# Patient Record
Sex: Female | Born: 1945
Health system: Southern US, Community
[De-identification: ages and names within clinical notes are randomized; demographics above are authoritative.]

## PROBLEM LIST (undated history)

## (undated) DIAGNOSIS — I499 Cardiac arrhythmia, unspecified: Secondary | ICD-10-CM

## (undated) DIAGNOSIS — M810 Age-related osteoporosis without current pathological fracture: Secondary | ICD-10-CM

## (undated) DIAGNOSIS — H269 Unspecified cataract: Secondary | ICD-10-CM

## (undated) DIAGNOSIS — J302 Other seasonal allergic rhinitis: Secondary | ICD-10-CM

## (undated) DIAGNOSIS — D126 Benign neoplasm of colon, unspecified: Secondary | ICD-10-CM

## (undated) HISTORY — DX: Age-related osteoporosis without current pathological fracture: M81.0

## (undated) HISTORY — PX: OTHER SURGICAL HISTORY: SHX169

## (undated) HISTORY — PX: ABDOMINAL HYSTERECTOMY: SHX81

## (undated) HISTORY — DX: Unspecified cataract: H26.9

## (undated) HISTORY — PX: BREAST EXCISIONAL BIOPSY: SUR124

## (undated) HISTORY — PX: BREAST SURGERY: SHX581

---

## 2006-02-24 ENCOUNTER — Ambulatory Visit (HOSPITAL_COMMUNITY): Admission: RE | Admit: 2006-02-24 | Discharge: 2006-02-24 | Payer: Self-pay | Admitting: Pulmonary Disease

## 2010-06-08 DIAGNOSIS — D126 Benign neoplasm of colon, unspecified: Secondary | ICD-10-CM

## 2010-06-08 HISTORY — DX: Benign neoplasm of colon, unspecified: D12.6

## 2011-08-31 ENCOUNTER — Other Ambulatory Visit: Payer: Self-pay

## 2011-08-31 ENCOUNTER — Telehealth: Payer: Self-pay

## 2011-08-31 DIAGNOSIS — Z139 Encounter for screening, unspecified: Secondary | ICD-10-CM

## 2011-08-31 NOTE — Telephone Encounter (Signed)
MOVI PREP SPLIT DOSING, REGULAR BREAKFAST. CLEAR LIQUIDS AFTER 9 AM.  

## 2011-08-31 NOTE — Telephone Encounter (Signed)
Pt said her last colonoscopy was approximately in 2000 by Dr. Cleotis Nipper at [facility].   Gastroenterology Pre-Procedure Form   Request Date: 08/31/2011     Requesting Physician: Dr. Juanetta Gosling     PATIENT INFORMATION:  Karen Nunez is a 66 y.o., female (DOB=08-13-1945).  PROCEDURE: Procedure(s) requested: colonoscopy Procedure Reason: screening for colon cancer  PATIENT REVIEW QUESTIONS: The patient reports the following:   1. Diabetes Melitis: no 2. Joint replacements in the past 12 months: no 3. Major health problems in the past 3 months: no 4. Has an artificial valve or MVP:no 5. Has been advised in past to take antibiotics in advance of a procedure like teeth cleaning: no}    MEDICATIONS & ALLERGIES:    Patient reports the following regarding taking any blood thinners:   Plavix? no Aspirin?no Coumadin?  no  Patient confirms/reports the following medications: only multi vit and calcium plus D ( did not show up) No current outpatient prescriptions on file.    Patient confirms/reports the following allergies:  Allergies not on file  NKDA  Patient is appropriate to schedule for requested procedure(s): yes  AUTHORIZATION INFORMATION Primary Insurance:  ID #:   Group #:  Pre-Cert / Auth required: Pre-Cert / Auth #:   Secondary Insurance:   ID #:   Group #:  Pre-Cert / Auth required: Pre-Cert / Auth #:   No orders of the defined types were placed in this encounter.    SCHEDULE INFORMATION: Procedure has been scheduled as follows:  Date: 09/25/2011     Time: 10:30 AM  Location: [facility] Stay  This Gastroenterology Pre-Precedure Form is being routed to the following provider(s) for review: Jonette Eva, MD

## 2011-09-01 NOTE — Telephone Encounter (Signed)
Rx and instructions mailed.  

## 2011-09-25 ENCOUNTER — Encounter (HOSPITAL_COMMUNITY): Payer: Self-pay | Admitting: *Deleted

## 2011-09-25 ENCOUNTER — Encounter (HOSPITAL_COMMUNITY)
Admission: RE | Disposition: A | Discharge status: Home / Self Care | Payer: Self-pay | Source: Ambulatory Visit | Attending: Gastroenterology

## 2011-09-25 ENCOUNTER — Ambulatory Visit (HOSPITAL_COMMUNITY)
Admission: RE | Admit: 2011-09-25 | Discharge: 2011-09-25 | Disposition: A | Discharge status: Home / Self Care | Payer: BC Managed Care – PPO | Source: Ambulatory Visit | Attending: Gastroenterology | Admitting: Gastroenterology

## 2011-09-25 DIAGNOSIS — Z1211 Encounter for screening for malignant neoplasm of colon: Secondary | ICD-10-CM

## 2011-09-25 DIAGNOSIS — K621 Rectal polyp: Secondary | ICD-10-CM

## 2011-09-25 DIAGNOSIS — D126 Benign neoplasm of colon, unspecified: Secondary | ICD-10-CM

## 2011-09-25 DIAGNOSIS — K648 Other hemorrhoids: Secondary | ICD-10-CM | POA: Insufficient documentation

## 2011-09-25 DIAGNOSIS — K5730 Diverticulosis of large intestine without perforation or abscess without bleeding: Secondary | ICD-10-CM

## 2011-09-25 DIAGNOSIS — K620 Anal polyp: Secondary | ICD-10-CM

## 2011-09-25 DIAGNOSIS — Z139 Encounter for screening, unspecified: Secondary | ICD-10-CM

## 2011-09-25 DIAGNOSIS — D128 Benign neoplasm of rectum: Secondary | ICD-10-CM | POA: Insufficient documentation

## 2011-09-25 HISTORY — PX: COLONOSCOPY: SHX5424

## 2011-09-25 SURGERY — COLONOSCOPY
Anesthesia: Moderate Sedation

## 2011-09-25 MED ORDER — MIDAZOLAM HCL 5 MG/5ML IJ SOLN
INTRAMUSCULAR | Status: AC
  Filled 2011-09-25: qty 10

## 2011-09-25 MED ORDER — STERILE WATER FOR IRRIGATION IR SOLN
Status: DC | PRN
  Administered 2011-09-25: 10:00:00

## 2011-09-25 MED ORDER — SODIUM CHLORIDE 0.45 % IV SOLN
Freq: Once | INTRAVENOUS | Status: AC
  Administered 2011-09-25: 10:00:00 via INTRAVENOUS

## 2011-09-25 MED ORDER — MEPERIDINE HCL 100 MG/ML IJ SOLN
INTRAMUSCULAR | Status: AC
  Filled 2011-09-25: qty 2

## 2011-09-25 MED ORDER — MEPERIDINE HCL 100 MG/ML IJ SOLN
INTRAMUSCULAR | Status: DC | PRN
  Administered 2011-09-25 (×2): 25 mg via INTRAVENOUS

## 2011-09-25 MED ORDER — MIDAZOLAM HCL 5 MG/5ML IJ SOLN
INTRAMUSCULAR | Status: DC | PRN
  Administered 2011-09-25: 2 mg via INTRAVENOUS
  Administered 2011-09-25 (×2): 1 mg via INTRAVENOUS
  Administered 2011-09-25: 2 mg via INTRAVENOUS

## 2011-09-25 NOTE — Op Note (Signed)
[facility] [address] [place], [place]  [address]  COLONOSCOPY PROCEDURE REPORT  PATIENT:  Karen, Nunez  MR#:  [id number] BIRTHDATE:  [birth year], 65 yrs. old  GENDER:  female  ENDOSCOPIST:  Jonette Eva, MD REF. BY:  Kari Baars, M.D. ASSISTANT:  PROCEDURE DATE:  09/25/2011 PROCEDURE:  Colonoscopy with biopsy  INDICATIONS:  SCREENING  MEDICATIONS:   Demerol 50 mg IV, Versed 6 mg IV  DESCRIPTION OF PROCEDURE:    Physical exam was performed. Informed consent was obtained from the patient after explaining the benefits, risks, and alternatives to procedure.  The patient was connected to monitor and placed in left lateral position. Continuous oxygen was provided by nasal cannula and IV medicine administered through an indwelling cannula.  After administration of sedation and rectal exam, the patient's rectum was intubated and the EC-[id number] ([id number]) colonoscope was advanced under direct visualization to the cecum.  The scope was removed slowly by carefully examining the color, texture, anatomy, and integrity mucosa on the way out.  The patient was recovered in endoscopy and discharged home in satisfactory condition. <<PROCEDUREIMAGES>>  FINDINGS:  There wAS A 3 MM SESSILE polyp identified and removed. in the descending colon.  There were TWO 2 MMpolyps identified and removed. in the rectum. POLYPS REMOVED VIA COLD FORCEPS.  FREQUENT Diverticula were found throughout the colon. SMALL Internal Hemorrhoids were found.  PREP QUALITY: GOOD CECAL W/D TIME:      COMPLICATIONS:    None  ENDOSCOPIC IMPRESSION: 1) Polyp in the descending colon 2) Polyps, multiple in the rectum 3) DiverticulOSIS throughout the colon 4) Internal hemorrhoids  RECOMMENDATIONS: AWAIT BIOPSIES HIGH FIBER DIET TCS IN 10 YEARS  REPEAT EXAM:  No  ______________________________ Jonette Eva, MD  CC:  Kari Baars, M.D.  n. eSIGNED:   Notnamed Croucher at 09/25/2011 02:49  PM  Milbert Coulter, [phone]

## 2011-09-25 NOTE — H&P (Addendum)
  Primary Care Physician:  Fredirick Maudlin, MD, MD Primary Gastroenterologist:  Dr. Darrick Penna  Pre-Procedure History & Physical: HPI:  Karen Nunez is a 66 y.o. female here for COLON CANCER SCREENING.   Past Medical History  Diagnosis Date  . No pertinent past medical history     Past Surgical History  Procedure Date  . Abdominal hysterectomy   . Bilateral breast lumpectomies     all benign    Prior to Admission medications   Medication Sig Start Date End Date Taking? Authorizing Provider  Multiple Vitamin (MULTIVITAMIN) tablet Take 1 tablet by mouth daily.   Yes Historical Provider, MD  NON FORMULARY Calcium 600 mg plus Vit D       One tablet bid   Yes Historical Provider, MD    Allergies as of 08/31/2011  . (No Known Allergies)    Family History  Problem Relation Age of Onset  . Colon cancer Neg Hx     History   Social History  . Marital Status: Married    Spouse Name: N/A    Number of Children: N/A  . Years of Education: N/A   Occupational History  . Not on file.   Social History Main Topics  . Smoking status: Never Smoker   . Smokeless tobacco: Not on file  . Alcohol Use: Yes     occasional  . Drug Use: No  . Sexually Active:    Other Topics Concern  . Not on file   Social History Narrative  . No narrative on file    Review of Systems: See HPI, otherwise negative ROS  Physical Exam: BP 152/91  Pulse 97  Temp(Src) 97.3 F (36.3 C) (Oral)  Resp 16  Ht 5' 6.5" (1.689 m)  Wt 170 lb (77.111 kg)  BMI 27.03 kg/m2  SpO2 98% General:   Alert,  pleasant and cooperative in NAD Head:  Normocephalic and atraumatic. Neck:  Supple;  Lungs:  Clear throughout to auscultation.    Heart:  Regular rate and rhythm. Abdomen:  Soft, nontender and nondistended. Normal bowel sounds, without guarding, and without rebound.   Neurologic:  Alert and  oriented x4;  grossly normal neurologically.   Impression/Plan:    AVERAGE RISK  PLAN: TCS TODAY

## 2011-09-25 NOTE — Discharge Instructions (Signed)
You had 3 small polyps removed FROM YOUR COLON AND RECTUM. You have internal hemorrhoids and diverticulosis in your entire colon.   FOLLOW A HIGH FIBER DIET. AVOID ITEMS THAT CAUSE BLOATING. SEE INFO BELOW. YOUR BIOPSY RESULTS SHOULD BE BACK IN 7 DAYS. Next colonoscopy in 10 years.  Colonoscopy Care After Read the instructions outlined below and refer to this sheet in the next week. These discharge instructions provide you with general information on caring for yourself after you leave the hospital. While your treatment has been planned according to the most current medical practices available, unavoidable complications occasionally occur. If you have any problems or questions after discharge, call DR. Princella Jaskiewicz, [phone].  ACTIVITY  You may resume your regular activity, but move at a slower pace for the next 24 hours.   Take frequent rest periods for the next 24 hours.   Walking will help get rid of the air and reduce the bloated feeling in your belly (abdomen).   No driving for 24 hours (because of the medicine (anesthesia) used during the test).   You may shower.   Do not sign any important legal documents or operate any machinery for 24 hours (because of the anesthesia used during the test).    NUTRITION  Drink plenty of fluids.   You may resume your normal diet as instructed by your doctor.   Begin with a light meal and progress to your normal diet. Heavy or fried foods are harder to digest and may make you feel sick to your stomach (nauseated).   Avoid alcoholic beverages for 24 hours or as instructed.    MEDICATIONS  You may resume your normal medications.   WHAT YOU CAN EXPECT TODAY  Some feelings of bloating in the abdomen.   Passage of more gas than usual.   Spotting of blood in your stool or on the toilet paper  .  IF YOU HAD POLYPS REMOVED DURING THE COLONOSCOPY:  Eat a soft diet IF YOU HAVE NAUSEA, BLOATING, ABDOMINAL PAIN, OR VOMITING.    FINDING  OUT THE RESULTS OF YOUR TEST Not all test results are available during your visit. DR. Darrick Penna WILL CALL YOU WITHIN 7 DAYS OF YOUR PROCEDUE WITH YOUR RESULTS. Do not assume everything is normal if you have not heard from DR. Jule Schlabach IN ONE WEEK, CALL HER OFFICE AT [phone].  SEEK IMMEDIATE MEDICAL ATTENTION AND CALL THE OFFICE: [phone] IF:  You have more than a spotting of blood in your stool.   Your belly is swollen (abdominal distention).   You are nauseated or vomiting.   You have a temperature over 101F.   You have abdominal pain or discomfort that is severe or gets worse throughout the day.  Polyps, Colon  A polyp is extra tissue that grows inside your body. Colon polyps grow in the large intestine. The large intestine, also called the colon, is part of your digestive system. It is a long, hollow tube at the end of your digestive tract where your body makes and stores stool. Most polyps are not dangerous. They are benign. This means they are not cancerous. But over time, some types of polyps can turn into cancer. Polyps that are smaller than a pea are usually not harmful. But larger polyps could someday become or may already be cancerous. To be safe, doctors remove all polyps and test them.   WHO GETS POLYPS? Anyone can get polyps, but certain people are more likely than others. You may have a greater chance  of getting polyps if:  You are over 50.   You have had polyps before.   Someone in your family has had polyps.   Someone in your family has had cancer of the large intestine.   Find out if someone in your family has had polyps. You may also be more likely to get polyps if you:   Eat a lot of fatty foods   Smoke   Drink alcohol   Do not exercise  Eat too much   TREATMENT  The caregiver will remove the polyp during sigmoidoscopy or colonoscopy.  PREVENTION There is not one sure way to prevent polyps. You might be able to lower your risk of getting them if  you:  Eat more fruits and vegetables and less fatty food.   Do not smoke.   Avoid alcohol.   Exercise every day.   Lose weight if you are overweight.   Eating more calcium and folate can also lower your risk of getting polyps. Some foods that are rich in calcium are milk, cheese, and broccoli. Some foods that are rich in folate are chickpeas, kidney beans, and spinach.   High-Fiber Diet A high-fiber diet changes your normal diet to include more whole grains, legumes, fruits, and vegetables. Changes in the diet involve replacing refined carbohydrates with unrefined foods. The calorie level of the diet is essentially unchanged. The Dietary Reference Intake (recommended amount) for adult males is 38 grams per day. For adult females, it is 25 grams per day. Pregnant and lactating women should consume 28 grams of fiber per day. Fiber is the intact part of a plant that is not broken down during digestion. Functional fiber is fiber that has been isolated from the plant to provide a beneficial effect in the body. PURPOSE  Increase stool bulk.   Ease and regulate bowel movements.   Lower cholesterol.  INDICATIONS THAT YOU NEED MORE FIBER  Constipation and hemorrhoids.   Uncomplicated diverticulosis (intestine condition) and irritable bowel syndrome.   Weight management.   As a protective measure against hardening of the arteries (atherosclerosis), diabetes, and cancer.   GUIDELINES FOR INCREASING FIBER IN THE DIET  Start adding fiber to the diet slowly. A gradual increase of about 5 more grams (2 slices of whole-wheat bread, 2 servings of most fruits or vegetables, or 1 bowl of high-fiber cereal) per day is best. Too rapid an increase in fiber may result in constipation, flatulence, and bloating.   Drink enough water and fluids to keep your urine clear or pale yellow. Water, juice, or caffeine-free drinks are recommended. Not drinking enough fluid may cause constipation.   Eat a  variety of high-fiber foods rather than one type of fiber.   Try to increase your intake of fiber through using high-fiber foods rather than fiber pills or supplements that contain small amounts of fiber.   The goal is to change the types of food eaten. Do not supplement your present diet with high-fiber foods, but replace foods in your present diet.  INCLUDE A VARIETY OF FIBER SOURCES  Replace refined and processed grains with whole grains, canned fruits with fresh fruits, and incorporate other fiber sources. White rice, white breads, and most bakery goods contain little or no fiber.   Brown whole-grain rice, buckwheat oats, and many fruits and vegetables are all good sources of fiber. These include: broccoli, Brussels sprouts, cabbage, cauliflower, beets, sweet potatoes, white potatoes (skin on), carrots, tomatoes, eggplant, squash, berries, fresh fruits, and dried fruits.  Cereals appear to be the richest source of fiber. Cereal fiber is found in whole grains and bran. Bran is the fiber-rich outer coat of cereal grain, which is largely removed in refining. In whole-grain cereals, the bran remains. In breakfast cereals, the largest amount of fiber is found in those with "bran" in their names. The fiber content is sometimes indicated on the label.   You may need to include additional fruits and vegetables each day.   In baking, for 1 cup white flour, you may use the following substitutions:   1 cup whole-wheat flour minus 2 tablespoons.   1/2 cup white flour plus 1/2 cup whole-wheat flour.   Diverticulosis Diverticulosis is a common condition that develops when small pouches (diverticula) form in the wall of the colon. The risk of diverticulosis increases with age. It happens more often in people who eat a low-fiber diet. Most individuals with diverticulosis have no symptoms. Those individuals with symptoms usually experience belly (abdominal) pain, constipation, or loose stools  (diarrhea).  HOME CARE INSTRUCTIONS  Increase the amount of fiber in your diet as directed by your caregiver or dietician. This may reduce symptoms of diverticulosis.   Drink at least 6 to 8 glasses of water each day to prevent constipation.   Try not to strain when you have a bowel movement.   Avoiding nuts and seeds to prevent complications is still an uncertain benefit.       FOODS HAVING HIGH FIBER CONTENT INCLUDE:  Fruits. Apple, peach, pear, tangerine, raisins, prunes.   Vegetables. Brussels sprouts, asparagus, broccoli, cabbage, carrot, cauliflower, romaine lettuce, spinach, summer squash, tomato, winter squash, zucchini.   Starchy Vegetables. Baked beans, kidney beans, lima beans, split peas, lentils, potatoes (with skin).   Grains. Whole wheat bread, brown rice, bran flake cereal, plain oatmeal, white rice, shredded wheat, bran muffins.    SEEK IMMEDIATE MEDICAL CARE IF:  You develop increasing pain or severe bloating.   You have an oral temperature above 101F.   You develop vomiting or bowel movements that are bloody or black.   Hemorrhoids Hemorrhoids are dilated (enlarged) veins around the rectum. Sometimes clots will form in the veins. This makes them swollen and painful. These are called thrombosed hemorrhoids. Causes of hemorrhoids include:  Constipation.   Straining to have a bowel movement.   HEAVY LIFTING HOME CARE INSTRUCTIONS  Eat a well balanced diet and drink 6 to 8 glasses of water every day to avoid constipation. You may also use a bulk laxative.   Avoid straining to have bowel movements.   Keep anal area dry and clean.   Do not use a donut shaped pillow or sit on the toilet for long periods. This increases blood pooling and pain.   Move your bowels when your body has the urge; this will require less straining and will decrease pain and pressure.

## 2011-09-29 ENCOUNTER — Encounter (HOSPITAL_COMMUNITY): Payer: Self-pay | Admitting: Gastroenterology

## 2011-09-30 ENCOUNTER — Telehealth: Payer: Self-pay | Admitting: Gastroenterology

## 2011-09-30 NOTE — Telephone Encounter (Signed)
Pt returned call and was informed.  

## 2011-09-30 NOTE — Telephone Encounter (Signed)
Results Cc to PCP  

## 2011-09-30 NOTE — Telephone Encounter (Signed)
Please call pt. She had HYPERPLASTIC POLYPS removed from her colon & RECTUM. TCS in 10 years. High fiber diet.

## 2011-09-30 NOTE — Telephone Encounter (Signed)
LM for pt to call

## 2013-11-13 ENCOUNTER — Other Ambulatory Visit (HOSPITAL_COMMUNITY): Payer: Self-pay | Admitting: Pulmonary Disease

## 2013-11-13 ENCOUNTER — Ambulatory Visit (HOSPITAL_COMMUNITY)
Admission: RE | Admit: 2013-11-13 | Discharge: 2013-11-13 | Disposition: A | Discharge status: Home / Self Care | Payer: Medicare HMO | Source: Ambulatory Visit | Attending: Pulmonary Disease | Admitting: Pulmonary Disease

## 2013-11-13 DIAGNOSIS — R52 Pain, unspecified: Secondary | ICD-10-CM

## 2013-11-13 DIAGNOSIS — M898X9 Other specified disorders of bone, unspecified site: Secondary | ICD-10-CM | POA: Insufficient documentation

## 2013-11-13 DIAGNOSIS — M79609 Pain in unspecified limb: Secondary | ICD-10-CM | POA: Insufficient documentation

## 2014-11-27 ENCOUNTER — Other Ambulatory Visit (HOSPITAL_COMMUNITY): Payer: Self-pay | Admitting: Pulmonary Disease

## 2014-11-27 DIAGNOSIS — Z0000 Encounter for general adult medical examination without abnormal findings: Secondary | ICD-10-CM | POA: Diagnosis not present

## 2014-11-27 DIAGNOSIS — Z780 Asymptomatic menopausal state: Secondary | ICD-10-CM

## 2014-11-28 DIAGNOSIS — F329 Major depressive disorder, single episode, unspecified: Secondary | ICD-10-CM | POA: Diagnosis not present

## 2014-11-28 DIAGNOSIS — E785 Hyperlipidemia, unspecified: Secondary | ICD-10-CM | POA: Diagnosis not present

## 2014-11-28 DIAGNOSIS — Z0000 Encounter for general adult medical examination without abnormal findings: Secondary | ICD-10-CM | POA: Diagnosis not present

## 2014-12-04 ENCOUNTER — Ambulatory Visit (HOSPITAL_COMMUNITY)
Admission: RE | Admit: 2014-12-04 | Discharge: 2014-12-04 | Disposition: A | Discharge status: Home / Self Care | Payer: Medicare PPO | Source: Ambulatory Visit | Attending: Pulmonary Disease | Admitting: Pulmonary Disease

## 2014-12-04 DIAGNOSIS — Z780 Asymptomatic menopausal state: Secondary | ICD-10-CM | POA: Insufficient documentation

## 2014-12-04 DIAGNOSIS — M898X8 Other specified disorders of bone, other site: Secondary | ICD-10-CM | POA: Diagnosis not present

## 2015-06-28 IMAGING — CR DG OS CALCIS 2+V*R*
2 series · 2 of 2 positions shown · non-contrast
Comparison: None.

CLINICAL DATA: Heel pain for 2 months.

EXAM:
RIGHT OS CALCIS - 2+ VIEW

[view not recorded (1 of 2)]
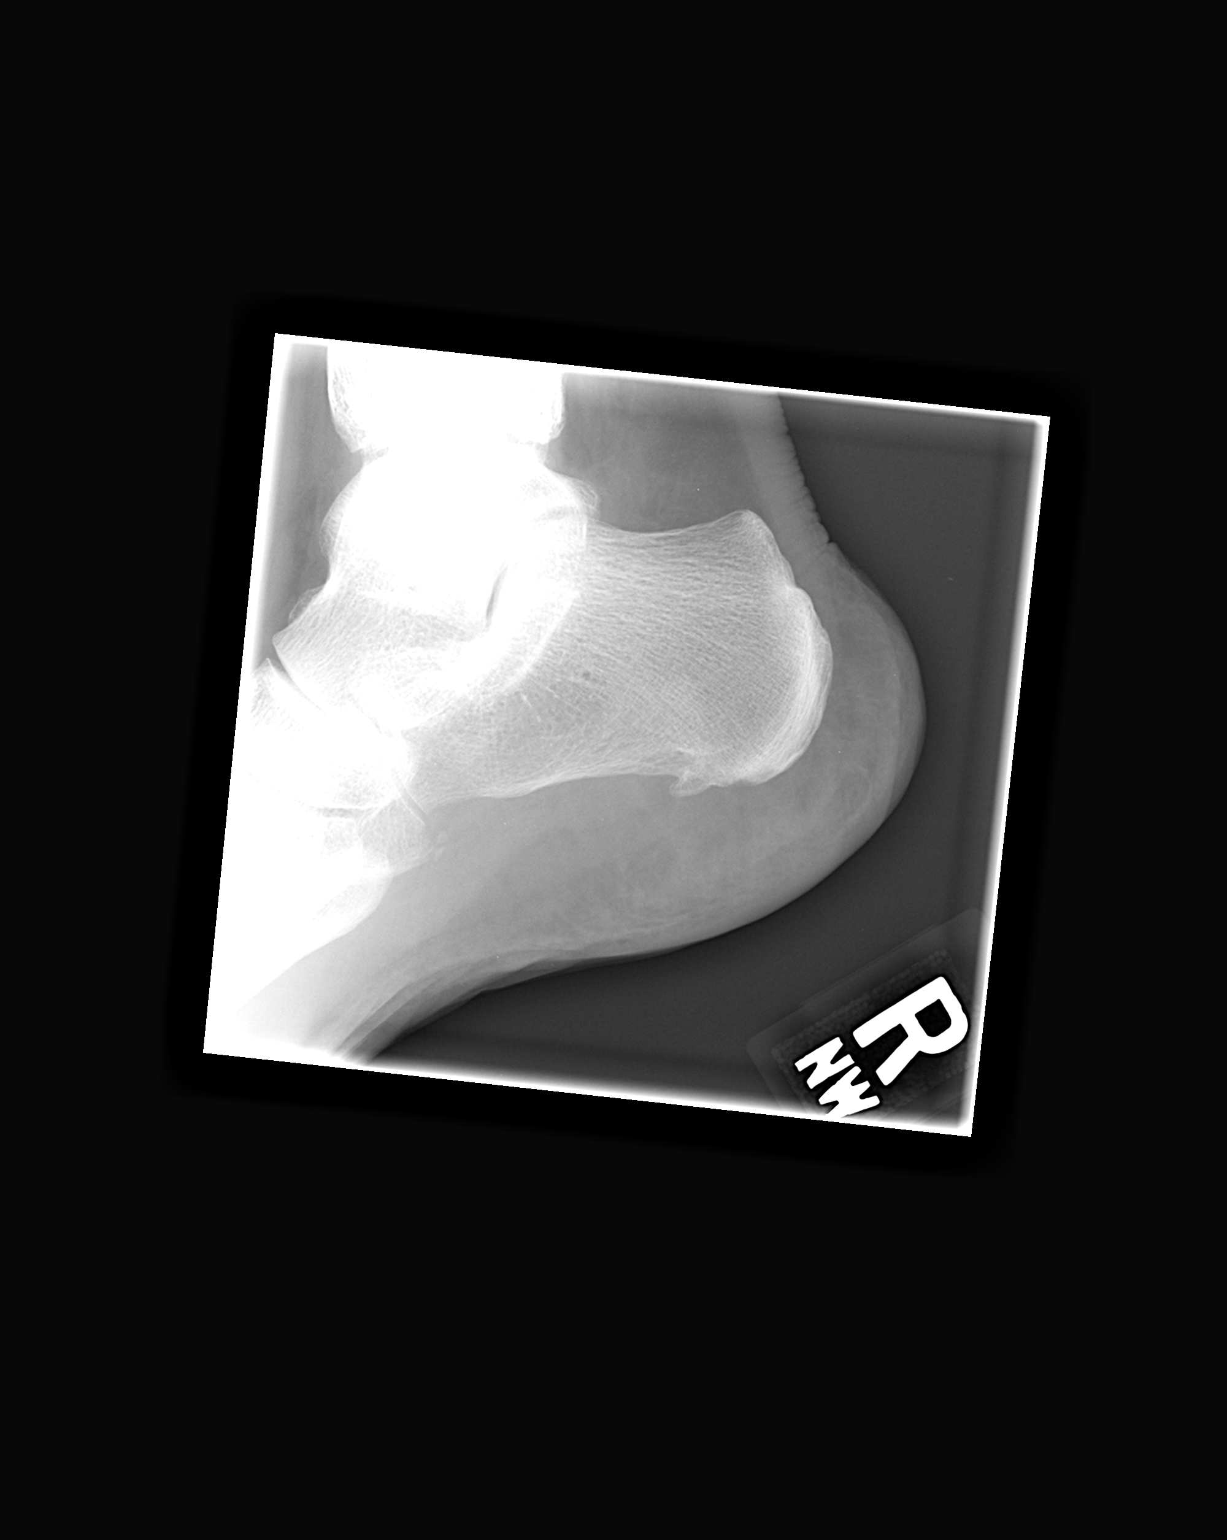

[view not recorded (2 of 2)]
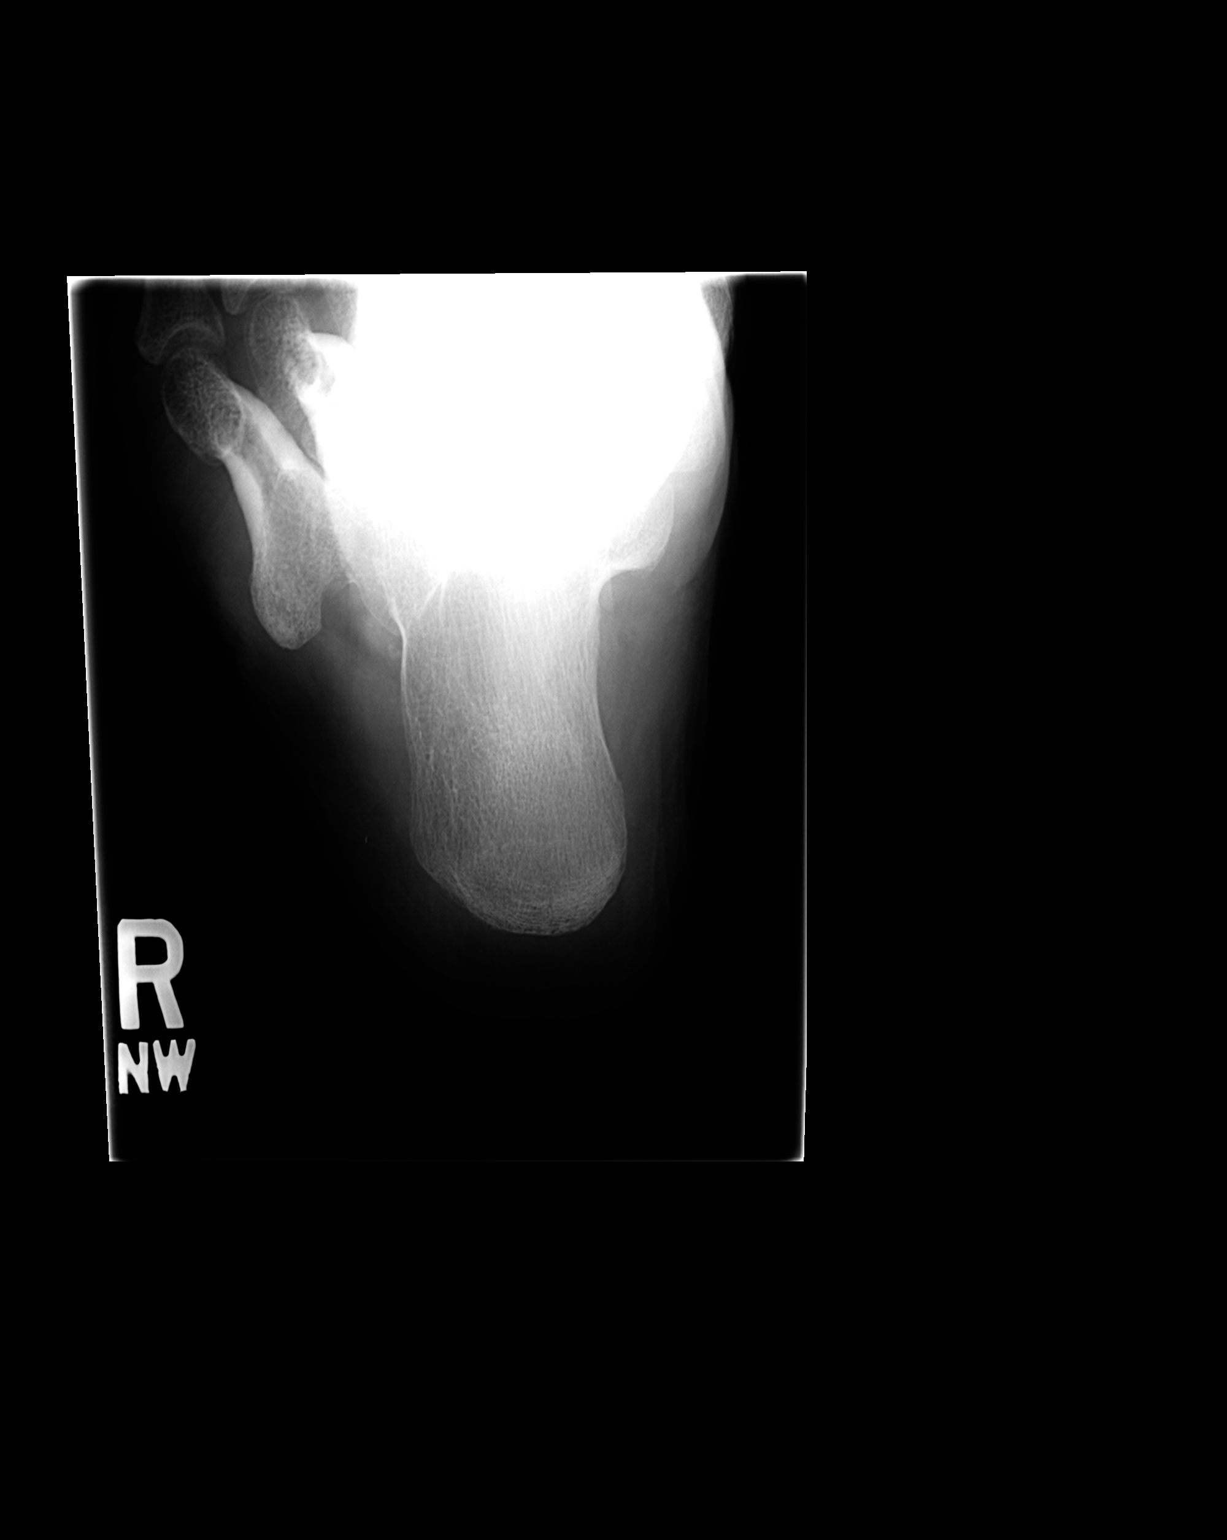

[2 of 2 positions shown; findings below may reference images not displayed]

FINDINGS: Small to moderate-size plantar spur. Limited evaluation of the
plantar fascia by plain film examination.

No fracture or dislocation.
IMPRESSION: Plantar spur.

## 2016-02-19 LAB — LIPID PANEL
Cholesterol: 151 (ref 0–200)
LDL Cholesterol: 85
Triglycerides: 54 (ref 40–160)

## 2016-02-19 LAB — TSH: TSH: 1.36 (ref <=5.90)

## 2016-11-27 ENCOUNTER — Ambulatory Visit (HOSPITAL_COMMUNITY): Payer: Medicare Other | Attending: Orthopedic Surgery | Admitting: Occupational Therapy

## 2016-11-27 DIAGNOSIS — M25531 Pain in right wrist: Secondary | ICD-10-CM | POA: Diagnosis not present

## 2016-11-27 DIAGNOSIS — M25631 Stiffness of right wrist, not elsewhere classified: Secondary | ICD-10-CM

## 2016-11-27 DIAGNOSIS — R29898 Other symptoms and signs involving the musculoskeletal system: Secondary | ICD-10-CM

## 2016-11-27 NOTE — Patient Instructions (Signed)
Finger A/ROM  1) Towel crunch Place a small towel on a firm table top. Flatten out the towel and then place your hand on one end of it.  Next, flex your fingers 2-5 (index finger through pinky finger) as you pull the towel towards your hand.    2) Digit composite flexion/adduction (make a fist) Hold your hand up as shown. Open and close your hand into a fist and repeat. If you cannot make a full fist, then make a partial fist.    3) Thumb/finger opposition Touch the tip of the thumb to each fingertip one by one. Extend fingers fully after they are touched.      4) Digit Abduction/Adduction Hold hand palm down flat on table. Spread your fingers apart and back together.      Wrist AROM Exercises   1) Wrist Flexion  Start with wrist at edge of table, palm facing up. With wrist hanging slightly off table, curl wrist upward, and back down.      2) Wrist Extension  Start with wrist at edge of table, palm facing down. With wrist slightly off the edge of the table, curl wrist up and back down.      3) Radial Deviations  Start with forearm flat against a table, wrist hanging slightly off the edge, and palm facing the wall. Bending at the wrist only, and keeping palm facing the wall, bend wrist so fist is pointing towards the floor, back up to start position, and up towards the ceiling. Return to start.        4) WRIST PRONATION  Turn your forearm towards palm face down.  Keep your elbow bent and by the side of your  Body.      5) WRIST SUPINATION  Turn your forearm towards palm face up.  Keep your elbow bent and by the side of your  Body.      *Complete exercises __10____ times each, ____1-3___ times per day*

## 2016-11-27 NOTE — Therapy (Signed)
[place], [place], [address] Phone: [phone]   Fax:  [phone]  Occupational Therapy Evaluation  Patient Details  Name: Karen Nunez MRN: [id number] Date of Birth: October 02, 1945 Referring Provider: Dr. Elsie Saas  Encounter Date: 11/27/2016      OT End of Session - 11/27/16 1607    Visit Number 1   Number of Visits 8   Date for OT Re-Evaluation 12/27/16   Authorization Type [employer]   Authorization Time Period Before 10th visit   Authorization - Visit Number 1   Authorization - Number of Visits 10   OT Start Time 1351   OT Stop Time 1423   OT Time Calculation (min) 32 min   Activity Tolerance Patient tolerated treatment well   Behavior During Therapy [facility] for tasks assessed/performed      Past Medical History:  Diagnosis Date  . No pertinent past medical history     Past Surgical History:  Procedure Laterality Date  . ABDOMINAL HYSTERECTOMY    . Bilateral breast lumpectomies     all benign  . COLONOSCOPY  09/25/2011   Procedure: COLONOSCOPY;  Surgeon: Danie Binder, MD;  [facility];  Service: Endoscopy;  Laterality: N/A;  10:30 AM    There were no vitals filed for this visit.      Subjective Assessment - 11/27/16 1603    Subjective  S: I've been really worried over this hand.    Pertinent History Pt is a 71 y/o female s/p right distal radius fracture sustained on 10/15/16 after falling down a flight of stairs. Pt reports she was in a soft cast which has been discontinued, now she is not wearing any brace or cast. Dr. Elsie Saas referred pt to occupational therapy for evaluation and treatment.    Patient Stated Goals To be able to use my wrist and hand again.    Currently in Pain? Yes   Pain Score 3    Pain Location Wrist   Pain Orientation Right   Pain Descriptors / Indicators Aching;Sore   Pain Type Acute pain   Pain Radiating Towards none   Pain Onset More than  a month ago   Pain Frequency Intermittent   Aggravating Factors  movement   Pain Relieving Factors ice, pain medication   Effect of Pain on Daily Activities mod effect on ADL completion   Multiple Pain Sites No           [facility] OT Assessment - 11/27/16 1352      Assessment   Diagnosis Right distal radius fracture   Referring Provider Dr. Elsie Saas   Onset Date 10/15/16   Prior Therapy None     Precautions   Precautions None     Restrictions   Weight Bearing Restrictions No     Balance Screen   Has the patient fallen in the past 6 months Yes   How many times? 1   Has the patient had a decrease in activity level because of a fear of falling?  No   Is the patient reluctant to leave their home because of a fear of falling?  No     Prior Function   Level of Independence Independent   Vocation Retired   Leisure puzzles, shopping, going out to eat, using computer     ADL   ADL comments Pt is having difficulty with grooming, washing and rolling hair, cooking, dressing, all ADL tasks are difficult due  to begin right dominant     Written Expression   Dominant Hand Right     Cognition   Overall Cognitive Status Within Functional Limits for tasks assessed     ROM / Strength   AROM / PROM / Strength AROM;PROM;Strength     Palpation   Palpation comment Min/mod fascial restrictions along thenar eminence, wrist, dorsal and volar forearm     AROM   AROM Assessment Site Wrist;Forearm   Right/Left Forearm Right   Right Forearm Pronation 90 Degrees   Right Forearm Supination 60 Degrees   Right/Left Wrist Right   Right Wrist Extension 14 Degrees   Right Wrist Flexion 30 Degrees   Right Wrist Radial Deviation 10 Degrees   Right Wrist Ulnar Deviation 20 Degrees   Right/Left Finger --   Right Composite Finger Flexion 50%     PROM   PROM Assessment Site Forearm;Wrist   Right/Left Forearm Right   Right Forearm Pronation 90 Degrees   Right Forearm Supination 60 Degrees    Right/Left Wrist Right   Right Wrist Extension 23 Degrees   Right Wrist Flexion 40 Degrees   Right Wrist Radial Deviation 15 Degrees   Right Wrist Ulnar Deviation 20 Degrees     Strength   Strength Assessment Site Forearm;Wrist   Right/Left Forearm Right   Right Forearm Pronation 3-/5   Right Forearm Supination 3-/5   Right/Left Wrist Right   Right Wrist Flexion 2+/5   Right Wrist Extension 2+/5   Right Wrist Radial Deviation 3/5   Right Wrist Ulnar Deviation 3/5     Right Hand AROM   R Index  MCP 0-90 58 Degrees   R Index PIP 0-100 48 Degrees   R Index DIP 0-70 30 Degrees   R Long  MCP 0-90 54 Degrees   R Long PIP 0-100 54 Degrees   R Long DIP 0-70 35 Degrees   R Ring  MCP 0-90 48 Degrees   R Ring PIP 0-100 60 Degrees   R Ring DIP 0-70 40 Degrees   R Little  MCP 0-90 42 Degrees   R Little PIP 0-100 60 Degrees   R Little DIP 0-70 50 Degrees                         OT Education - 11/27/16 1607    Education provided Yes   Education Details finger A/ROM, wrist A/ROM   Person(s) Educated Patient   Methods Explanation;Demonstration;Handout   Comprehension Verbalized understanding;Returned demonstration          OT Short Term Goals - 11/27/16 1612      OT SHORT TERM GOAL #1   Title Pt will be provided with and educated on HEP to improve mobility required for improved use of RUE as dominant.    Time 4   Period Weeks   Status New     OT SHORT TERM GOAL #2   Title Pt will decrease pain to 2/10 or less in RUE to improve ability to use RUE during B/IADL completion.    Time 4   Period Weeks   Status New     OT SHORT TERM GOAL #3   Title Pt will decrease RUE fascial restrictions to minimal amounts to improve mobility required for grooming tasks.    Time 4   Period Weeks   Status New     OT SHORT TERM GOAL #4   Title Pt will improve right wrist and finger A/ROM  to WNL to improve ability to roll hair using RUE as dominant.    Time 4   Period Weeks    Status New     OT SHORT TERM GOAL #5   Title Pt will improve right wrist strength to 4+/5 to improve ability to complete meal preparation tasks.    Time 4   Period Weeks   Status New                  Plan - November 29, 2016 1608    Clinical Impression Statement A: Pt is a 71 y/o female s/p right distal radius fracture after a fall down stairs on 10/15/16, presenting with decreased functional use of dominant RUE. Per MD order pt has no restrictions, pt reports she has been trying to use her hand as much as possible and is squeezing a racketball for exercise. Grip and pinch strength not assessed at this time due to inability to make a full fist.    Occupational Profile and client history currently impacting functional performance Pt was independent prior to fracture and is motivated to return to prior level of functioning.    Occupational performance deficits (Please refer to evaluation for details): ADL's;IADL's;Rest and Sleep;Work;Leisure;Social Participation   Rehab Potential Good   OT Frequency 2x / week   OT Duration 4 weeks   OT Treatment/Interventions Self-care/ADL training;Therapeutic exercise;Patient/family education;Ultrasound;Splinting;Manual Therapy;Therapeutic activities;DME and/or AE instruction;Cryotherapy;Electrical Stimulation;Moist Heat;Passive range of motion   Plan P: Pt will benefit from skilled OT services to improve RUE ROM and strength, decrease pain and fascial restrictions, and increase functional use of RUE as dominant. Treatment plan: myofascial release, manual therapy, P/ROM, A/ROM, wrist strengthening, grip and pinch strengthening. Assess grip and pinch when appropriate and make goals   Clinical Decision Making Limited treatment options, no task modification necessary   OT Home Exercise Plan 11-30-22: finger and wrist A/ROM   Consulted and Agree with Plan of Care Patient      Patient will benefit from skilled therapeutic intervention in order to improve the  following deficits and impairments:  Impaired flexibility, Decreased strength, Decreased activity tolerance, Pain, Increased edema, Decreased range of motion, Increased fascial restricitons, Impaired UE functional use  Visit Diagnosis: Pain in right wrist  Stiffness of right wrist, not elsewhere classified  Other symptoms and signs involving the musculoskeletal system      G-Codes - 29-Nov-2016 1617    Functional Assessment Tool Used (Outpatient only) clinical judgement   Functional Limitation Carrying, moving and handling objects   Carrying, Moving and Handling Objects Current Status (M2707) At least 60 percent but less than 80 percent impaired, limited or restricted   Carrying, Moving and Handling Objects Goal Status (E6754) At least 20 percent but less than 40 percent impaired, limited or restricted      Problem List There are no active problems to display for this patient.  [name], OTR/L  [phone] November 29, 2016, 4:17 PM  [facility] [address] [place], [place], [address] Phone: [phone]   Fax:  [phone]  Name: Karen Nunez MRN: [id number] Date of Birth: November 27, 1945

## 2016-11-30 ENCOUNTER — Encounter (HOSPITAL_COMMUNITY): Payer: Self-pay | Admitting: Occupational Therapy

## 2016-11-30 ENCOUNTER — Ambulatory Visit (HOSPITAL_COMMUNITY): Payer: Medicare Other | Admitting: Occupational Therapy

## 2016-11-30 DIAGNOSIS — M25531 Pain in right wrist: Secondary | ICD-10-CM | POA: Diagnosis not present

## 2016-11-30 DIAGNOSIS — M25631 Stiffness of right wrist, not elsewhere classified: Secondary | ICD-10-CM

## 2016-11-30 DIAGNOSIS — R29898 Other symptoms and signs involving the musculoskeletal system: Secondary | ICD-10-CM

## 2016-11-30 NOTE — Therapy (Addendum)
[place], [place], [address] Phone: [phone]   Fax:  [phone]  Occupational Therapy Treatment  Patient Details  Name: Karen Nunez MRN: [id number] Date of Birth: [birth year] Referring Provider: Dr. Elsie Saas  Encounter Date: 11/30/2016      OT End of Session - 11/30/16 1607    Visit Number 2   Number of Visits 8   Date for OT Re-Evaluation 12/27/16   Authorization Type [employer]   Authorization Time Period Before 10th visit   Authorization - Visit Number 2   Authorization - Number of Visits 10   OT Start Time 1432   OT Stop Time 1513   OT Time Calculation (min) 41 min   Activity Tolerance Patient tolerated treatment well   Behavior During Therapy [facility] for tasks assessed/performed      Past Medical History:  Diagnosis Date  . No pertinent past medical history     Past Surgical History:  Procedure Laterality Date  . ABDOMINAL HYSTERECTOMY    . Bilateral breast lumpectomies     all benign  . COLONOSCOPY  09/25/2011   Procedure: COLONOSCOPY;  Surgeon: Danie Binder, MD;  [facility];  Service: Endoscopy;  Laterality: N/A;  10:30 AM    There were no vitals filed for this visit.      Subjective Assessment - 11/30/16 1431    Subjective  S: I've been doing my exercises three times a day.    Currently in Pain? Yes   Pain Score 5    Pain Location Wrist   Pain Orientation Right   Pain Descriptors / Indicators Aching;Sore   Pain Type Acute pain   Pain Radiating Towards none   Pain Onset More than a month ago   Pain Frequency Intermittent   Aggravating Factors  movement   Pain Relieving Factors ice, pain medication   Effect of Pain on Daily Activities mod effect on ADL completion   Multiple Pain Sites No            [facility] OT Assessment - 11/30/16 1431      Assessment   Diagnosis Right distal radius fracture     Precautions   Precautions None                   OT Treatments/Exercises (OP) - 11/30/16 1517      Exercises   Exercises Wrist;Hand     Wrist Exercises   Forearm Supination PROM;AROM;10 reps   Forearm Pronation PROM;AROM;10 reps   Wrist Flexion PROM;AROM;10 reps   Wrist Extension PROM;AROM;10 reps   Wrist Radial Deviation PROM;10 reps   Wrist Ulnar Deviation PROM;10 reps     Hand Exercises   MCPJ Flexion PROM;10 reps   MCPJ Extension PROM;10 reps   PIPJ Flexion PROM;10 reps   PIPJ Extension PROM;10 reps   DIPJ Flexion PROM;10 reps   DIPJ Extension PROM;10 reps   Thumb Opposition 10X   Digit Abduction/Adduction 10X   Digiticizer 3.0#, 10X   Other Hand Exercises Towel crumple, 10X   Other Hand Exercises Towel squeeze, 10X     Manual Therapy   Manual Therapy Myofascial release   Manual therapy comments completed separately from therapeutic exercises   Myofascial Release Myofascial release to right hand, wrist, volar and dorsal forearm to decrease pain and fascial restrictions and increase joint range of motion.                 OT  Education - 11/30/16 1611    Education provided Yes   Education Details provided evaluation and reviewed goals   Person(s) Educated Patient   Methods Explanation;Demonstration;Handout   Comprehension Verbalized understanding;Returned demonstration          OT Short Term Goals - 11/30/16 1610      OT SHORT TERM GOAL #1   Title Pt will be provided with and educated on HEP to improve mobility required for improved use of RUE as dominant.    Time 4   Period Weeks   Status On-going     OT SHORT TERM GOAL #2   Title Pt will decrease pain to 2/10 or less in RUE to improve ability to use RUE during B/IADL completion.    Time 4   Period Weeks   Status On-going     OT SHORT TERM GOAL #3   Title Pt will decrease RUE fascial restrictions to minimal amounts to improve mobility required for grooming tasks.    Time 4   Period Weeks   Status On-going     OT  SHORT TERM GOAL #4   Title Pt will improve right wrist and finger A/ROM to WNL to improve ability to roll hair using RUE as dominant.    Time 4   Period Weeks   Status On-going     OT SHORT TERM GOAL #5   Title Pt will improve right wrist strength to 4+/5 to improve ability to complete meal preparation tasks.    Time 4   Period Weeks   Status On-going                  Plan - 11/30/16 1607    Clinical Impression Statement A: Initiated myofascial release, manual therapy, P/ROM, A/ROM, fine motor coordination, and hand exercises this session. Pt required verbal cuing for form and technique, increased ROM noted with MP and IP joints at end of session. Pt reports she has been completing her HEP 3x/day.    Plan P: continue with manual therapy and P/ROM, sponge activity   OT Home Exercise Plan 6/22: finger and wrist A/ROM   Consulted and Agree with Plan of Care Patient      Patient will benefit from skilled therapeutic intervention in order to improve the following deficits and impairments:  Impaired flexibility, Decreased strength, Decreased activity tolerance, Pain, Increased edema, Decreased range of motion, Increased fascial restricitons, Impaired UE functional use  Visit Diagnosis: Pain in right wrist  Stiffness of right wrist, not elsewhere classified  Other symptoms and signs involving the musculoskeletal system    Problem List There are no active problems to display for this patient.  [name], OTR/L  [phone] 11/30/2016, 4:12 PM  [facility] [address] [place], [place], [address] Phone: [phone]   Fax:  [phone]  Name: Karen Nunez MRN: [id number] Date of Birth: Jan 17, 1946

## 2016-12-01 ENCOUNTER — Telehealth (HOSPITAL_COMMUNITY): Payer: Self-pay | Admitting: Pulmonary Disease

## 2016-12-01 NOTE — Telephone Encounter (Signed)
12/01/16 pt left [place] a message that we had called about some appointments available.  I looked at next week and didn't see anything and I told her that they must have gotten filled but we would keep her on the wait list.

## 2016-12-03 ENCOUNTER — Ambulatory Visit (HOSPITAL_COMMUNITY): Payer: Medicare Other | Admitting: Occupational Therapy

## 2016-12-03 ENCOUNTER — Encounter (HOSPITAL_COMMUNITY): Payer: Self-pay | Admitting: Occupational Therapy

## 2016-12-03 DIAGNOSIS — M25531 Pain in right wrist: Secondary | ICD-10-CM

## 2016-12-03 DIAGNOSIS — R29898 Other symptoms and signs involving the musculoskeletal system: Secondary | ICD-10-CM

## 2016-12-03 DIAGNOSIS — M25631 Stiffness of right wrist, not elsewhere classified: Secondary | ICD-10-CM

## 2016-12-03 NOTE — Therapy (Signed)
[place], [place], [address] Phone: [phone]   Fax:  [phone]  Occupational Therapy Treatment  Patient Details  Name: Karen Nunez MRN: [id number] Date of Birth: [birth year] Referring Provider: Dr. Elsie Saas  Encounter Date: 12/03/2016      OT End of Session - 12/03/16 1603    Visit Number 3   Number of Visits 8   Date for OT Re-Evaluation 12/27/16   Authorization Type [employer]   Authorization Time Period Before 10th visit   Authorization - Visit Number 3   Authorization - Number of Visits 10   OT Start Time 3557   OT Stop Time 1557   OT Time Calculation (min) 40 min   Activity Tolerance Patient tolerated treatment well   Behavior During Therapy [facility] for tasks assessed/performed      Past Medical History:  Diagnosis Date  . No pertinent past medical history     Past Surgical History:  Procedure Laterality Date  . ABDOMINAL HYSTERECTOMY    . Bilateral breast lumpectomies     all benign  . COLONOSCOPY  09/25/2011   Procedure: COLONOSCOPY;  Surgeon: Danie Binder, MD;  [facility];  Service: Endoscopy;  Laterality: N/A;  10:30 AM    There were no vitals filed for this visit.      Subjective Assessment - 12/03/16 1514    Subjective  S: I've been doing my exercises faithfully.    Currently in Pain? No/denies            [facility] OT Assessment - 12/03/16 1514      Assessment   Diagnosis Right distal radius fracture     Precautions   Precautions None                  OT Treatments/Exercises (OP) - 12/03/16 1517      Exercises   Exercises Wrist;Hand     Wrist Exercises   Forearm Supination PROM;AROM;10 reps   Forearm Pronation PROM;AROM;10 reps   Wrist Flexion PROM;AROM;10 reps   Wrist Extension PROM;AROM;10 reps   Wrist Radial Deviation PROM;AROM;10 reps   Wrist Ulnar Deviation PROM;AROM;10 reps     Hand Exercises   MCPJ Flexion PROM;10 reps    MCPJ Extension PROM;10 reps   PIPJ Flexion PROM;10 reps   PIPJ Extension PROM;10 reps   DIPJ Flexion PROM;10 reps   DIPJ Extension PROM;10 reps   Thumb Opposition 5X   Digit Abduction/Adduction 10X   Digiticizer 3.0#, 10X   Other Hand Exercises Towel crumple, 10X. Towel squeeze, 10X   Other Hand Exercises Pt used yellow clothespin to grasp all soft sponges and place in bucket, using 3 point pinch.      Manual Therapy   Manual Therapy Myofascial release   Manual therapy comments completed separately from therapeutic exercises   Myofascial Release Myofascial release to right hand, wrist, volar and dorsal forearm to decrease pain and fascial restrictions and increase joint range of motion.                   OT Short Term Goals - 11/30/16 1610      OT SHORT TERM GOAL #1   Title Pt will be provided with and educated on HEP to improve mobility required for improved use of RUE as dominant.    Time 4   Period Weeks   Status On-going     OT SHORT TERM GOAL #2   Title Pt  will decrease pain to 2/10 or less in RUE to improve ability to use RUE during B/IADL completion.    Time 4   Period Weeks   Status On-going     OT SHORT TERM GOAL #3   Title Pt will decrease RUE fascial restrictions to minimal amounts to improve mobility required for grooming tasks.    Time 4   Period Weeks   Status On-going     OT SHORT TERM GOAL #4   Title Pt will improve right wrist and finger A/ROM to WNL to improve ability to roll hair using RUE as dominant.    Time 4   Period Weeks   Status On-going     OT SHORT TERM GOAL #5   Title Pt will improve right wrist strength to 4+/5 to improve ability to complete meal preparation tasks.    Time 4   Period Weeks   Status On-going                  Plan - 12/03/16 1604    Clinical Impression Statement A: Pt with improved range during passive stretching, added pinch activity with yellow clothespin, also able to gain wrist extension from  neutral position on tabletop this session. Pt requires intermittent verbal cuing for form and technqiue. Reports HEP is going well and she is completing 3x/day.    Plan P: continue with sustained passive stretching and P/ROM improving range within pt's pain tolerance. Measure grip/pinch when pt able to make functional fist   OT Home Exercise Plan 6/22: finger and wrist A/ROM   Consulted and Agree with Plan of Care Patient      Patient will benefit from skilled therapeutic intervention in order to improve the following deficits and impairments:  Impaired flexibility, Decreased strength, Decreased activity tolerance, Pain, Increased edema, Decreased range of motion, Increased fascial restricitons, Impaired UE functional use  Visit Diagnosis: Pain in right wrist  Stiffness of right wrist, not elsewhere classified  Other symptoms and signs involving the musculoskeletal system    Problem List There are no active problems to display for this patient.  [name], OTR/L  [phone] 12/03/2016, 4:07 PM  [facility] [address] [place], [place], [address] Phone: [phone]   Fax:  [phone]  Name: RAGHAD LORENZ MRN: [id number] Date of Birth: 27-Oct-1945

## 2016-12-07 ENCOUNTER — Ambulatory Visit (HOSPITAL_COMMUNITY): Payer: Medicare Other | Attending: Orthopedic Surgery | Admitting: Occupational Therapy

## 2016-12-07 ENCOUNTER — Encounter (HOSPITAL_COMMUNITY): Payer: Self-pay | Admitting: Occupational Therapy

## 2016-12-07 DIAGNOSIS — M25531 Pain in right wrist: Secondary | ICD-10-CM | POA: Insufficient documentation

## 2016-12-07 DIAGNOSIS — R29898 Other symptoms and signs involving the musculoskeletal system: Secondary | ICD-10-CM | POA: Diagnosis present

## 2016-12-07 DIAGNOSIS — M25631 Stiffness of right wrist, not elsewhere classified: Secondary | ICD-10-CM | POA: Diagnosis present

## 2016-12-07 NOTE — Therapy (Signed)
[place], [place], [address] Phone: [phone]   Fax:  [phone]  Occupational Therapy Treatment  Patient Details  Name: Karen Nunez MRN: [id number] Date of Birth: 1946/01/31 Referring Provider: Dr. Elsie Saas  Encounter Date: 12/07/2016      OT End of Session - 12/07/16 1512    Visit Number 4   Number of Visits 8   Date for OT Re-Evaluation 12/27/16   Authorization Type [employer]   Authorization Time Period Before 10th visit   Authorization - Visit Number 4   Authorization - Number of Visits 10   OT Start Time 1346   OT Stop Time 1428   OT Time Calculation (min) 42 min   Activity Tolerance Patient tolerated treatment well   Behavior During Therapy [facility] for tasks assessed/performed      Past Medical History:  Diagnosis Date  . No pertinent past medical history     Past Surgical History:  Procedure Laterality Date  . ABDOMINAL HYSTERECTOMY    . Bilateral breast lumpectomies     all benign  . COLONOSCOPY  09/25/2011   Procedure: COLONOSCOPY;  Surgeon: Danie Binder, MD;  [facility];  Service: Endoscopy;  Laterality: N/A;  10:30 AM    There were no vitals filed for this visit.      Subjective Assessment - 12/07/16 1346    Subjective  S: I think it's getting better.    Currently in Pain? No/denies            [facility] OT Assessment - 12/07/16 1346      Assessment   Diagnosis Right distal radius fracture     Precautions   Precautions None                  OT Treatments/Exercises (OP) - 12/07/16 1348      Exercises   Exercises Wrist;Hand     Wrist Exercises   Forearm Supination PROM;10 reps;AROM;15 reps   Forearm Pronation PROM;10 reps;AROM;15 reps   Wrist Flexion PROM;10 reps;AROM;15 reps   Wrist Extension PROM;10 reps;AROM;15 reps   Wrist Radial Deviation PROM;10 reps;AROM;15 reps   Wrist Ulnar Deviation PROM;10 reps;AROM;15 reps     Additional Wrist Exercises   Sponges 6, 7   Theraputty - Roll yellow     Hand Exercises   MCPJ Flexion PROM;10 reps   MCPJ Extension PROM;10 reps   PIPJ Flexion PROM;10 reps   PIPJ Extension PROM;10 reps   DIPJ Flexion PROM;10 reps   DIPJ Extension PROM;10 reps   Digit Abduction/Adduction 10X   Other Hand Exercises Digit composite flexion/extension, 10X, A/ROM   Other Hand Exercises Pt used yellow clothespin to grasp 10 high resistance sponges with 3 point pinch, 2 trials. Pt then grasped and placed 10 sponges in bucket using lateral pinch.      Manual Therapy   Manual Therapy Myofascial release   Manual therapy comments completed separately from therapeutic exercises   Myofascial Release Myofascial release to right hand, wrist, volar and dorsal forearm to decrease pain and fascial restrictions and increase joint range of motion.                   OT Short Term Goals - 11/30/16 1610      OT SHORT TERM GOAL #1   Title Pt will be provided with and educated on HEP to improve mobility required for improved use of RUE as dominant.    Time  4   Period Weeks   Status On-going     OT SHORT TERM GOAL #2   Title Pt will decrease pain to 2/10 or less in RUE to improve ability to use RUE during B/IADL completion.    Time 4   Period Weeks   Status On-going     OT SHORT TERM GOAL #3   Title Pt will decrease RUE fascial restrictions to minimal amounts to improve mobility required for grooming tasks.    Time 4   Period Weeks   Status On-going     OT SHORT TERM GOAL #4   Title Pt will improve right wrist and finger A/ROM to WNL to improve ability to roll hair using RUE as dominant.    Time 4   Period Weeks   Status On-going     OT SHORT TERM GOAL #5   Title Pt will improve right wrist strength to 4+/5 to improve ability to complete meal preparation tasks.    Time 4   Period Weeks   Status On-going                  Plan - 12/07/16 1512    Clinical Impression  Statement A: Continued with passive stretching this session, pt continues to improve range tolerated. Increased repetitions with wrist A/ROM, OT notes improvement in wrist extension and supination this session, verbal cuing for form during exercises. During pinch task pt required verbal cuing to use whole arm to reach for sponges versus leaning with body.    OT Treatment/Interventions Self-care/ADL training;Therapeutic exercise;Patient/family education;Ultrasound;Splinting;Manual Therapy;Therapeutic activities;DME and/or AE instruction;Cryotherapy;Electrical Stimulation;Moist Heat;Passive range of motion   Plan P: Measure grip and pinch strength. Continue with passive stretching working on improving P/ROM to [facility] and achieving fist   OT Home Exercise Plan 6/22: finger and wrist A/ROM   Consulted and Agree with Plan of Care Patient      Patient will benefit from skilled therapeutic intervention in order to improve the following deficits and impairments:     Visit Diagnosis: Pain in right wrist  Stiffness of right wrist, not elsewhere classified  Other symptoms and signs involving the musculoskeletal system    Problem List There are no active problems to display for this patient.   [name], OTR/L  [phone] 12/07/2016, 3:14 PM  [facility] [address] [place], [place], [address] Phone: [phone]   Fax:  [phone]  Name: Karen Nunez MRN: [id number] Date of Birth: 02-Aug-1945

## 2016-12-11 ENCOUNTER — Ambulatory Visit (HOSPITAL_COMMUNITY): Payer: Medicare Other | Admitting: Occupational Therapy

## 2016-12-11 ENCOUNTER — Encounter (HOSPITAL_COMMUNITY): Payer: Self-pay | Admitting: Occupational Therapy

## 2016-12-11 DIAGNOSIS — M25631 Stiffness of right wrist, not elsewhere classified: Secondary | ICD-10-CM

## 2016-12-11 DIAGNOSIS — M25531 Pain in right wrist: Secondary | ICD-10-CM | POA: Diagnosis not present

## 2016-12-11 DIAGNOSIS — R29898 Other symptoms and signs involving the musculoskeletal system: Secondary | ICD-10-CM

## 2016-12-11 NOTE — Patient Instructions (Signed)
Home Exercises Program °Theraputty Exercises °   °Do the following exercises 1-2 times a day using your affected hand.  °1. Roll putty into a ball. ° °2. Make into a pancake. ° °3. Roll putty into a roll. ° °4. Pinch along log with first finger and thumb.  ° °5. Make into a ball. ° °6. Roll it back into a log.  ° °7. Pinch using thumb and side of first finger. ° °8. Roll into a ball, then flatten into a pancake. ° °9. Using your fingers, make putty into a mountain. ° °

## 2016-12-11 NOTE — Therapy (Signed)
[place], [place], [address] Phone: [phone]   Fax:  [phone]  Occupational Therapy Treatment  Patient Details  Name: Karen Nunez MRN: [id number] Date of Birth: Oct 11, 1945 Referring Provider: Dr. Elsie Saas  Encounter Date: 12/11/2016      OT End of Session - 12/11/16 1504    Visit Number 5   Number of Visits 8   Date for OT Re-Evaluation 12/27/16   Authorization Type [employer]   Authorization Time Period Before 10th visit   Authorization - Visit Number 5   Authorization - Number of Visits 10   OT Start Time 5885   OT Stop Time 1431   OT Time Calculation (min) 43 min   Activity Tolerance Patient tolerated treatment well   Behavior During Therapy [facility] for tasks assessed/performed      Past Medical History:  Diagnosis Date  . No pertinent past medical history     Past Surgical History:  Procedure Laterality Date  . ABDOMINAL HYSTERECTOMY    . Bilateral breast lumpectomies     all benign  . COLONOSCOPY  09/25/2011   Procedure: COLONOSCOPY;  Surgeon: Danie Binder, MD;  [facility];  Service: Endoscopy;  Laterality: N/A;  10:30 AM    There were no vitals filed for this visit.      Subjective Assessment - 12/11/16 1348    Subjective  S: I've been picking things up and using the computer.    Currently in Pain? Yes   Pain Score 1    Pain Location Wrist   Pain Orientation Right            [facility] OT Assessment - 12/11/16 1348      Assessment   Diagnosis Right distal radius fracture     Precautions   Precautions None                  OT Treatments/Exercises (OP) - 12/11/16 1417      Exercises   Exercises Wrist;Hand     Wrist Exercises   Forearm Supination PROM;10 reps;AROM;15 reps   Forearm Pronation PROM;10 reps;AROM;15 reps   Wrist Flexion PROM;10 reps;AROM;15 reps   Wrist Extension PROM;10 reps;AROM;15 reps   Wrist Radial Deviation PROM;10  reps;AROM;15 reps   Wrist Ulnar Deviation PROM;10 reps;AROM;15 reps     Additional Wrist Exercises   Theraputty - Flatten yellow   Theraputty - Grip yellow   Theraputty - Pinch yellow-3 point and lateral pinch     Hand Exercises   MCPJ Flexion PROM;10 reps   MCPJ Extension PROM;10 reps   PIPJ Flexion PROM;10 reps   PIPJ Extension PROM;10 reps   DIPJ Flexion PROM;10 reps   DIPJ Extension PROM;10 reps   Other Hand Exercises Digit composite flexion/extension, 10X, A/ROM. Finger taps: 10X   Other Hand Exercises Pt grasped orange/white soft cone and squeezed 10X     Manual Therapy   Manual Therapy Myofascial release   Manual therapy comments completed separately from therapeutic exercises   Myofascial Release Myofascial release to right hand, wrist, volar and dorsal forearm to decrease pain and fascial restrictions and increase joint range of motion.                 OT Education - 12/11/16 1504    Education provided Yes   Education Details yellow theraputty   Person(s) Educated Patient   Methods Explanation;Demonstration;Handout   Comprehension Verbalized understanding;Returned demonstration  OT Short Term Goals - 11/30/16 1610      OT SHORT TERM GOAL #1   Title Pt will be provided with and educated on HEP to improve mobility required for improved use of RUE as dominant.    Time 4   Period Weeks   Status On-going     OT SHORT TERM GOAL #2   Title Pt will decrease pain to 2/10 or less in RUE to improve ability to use RUE during B/IADL completion.    Time 4   Period Weeks   Status On-going     OT SHORT TERM GOAL #3   Title Pt will decrease RUE fascial restrictions to minimal amounts to improve mobility required for grooming tasks.    Time 4   Period Weeks   Status On-going     OT SHORT TERM GOAL #4   Title Pt will improve right wrist and finger A/ROM to WNL to improve ability to roll hair using RUE as dominant.    Time 4   Period Weeks   Status  On-going     OT SHORT TERM GOAL #5   Title Pt will improve right wrist strength to 4+/5 to improve ability to complete meal preparation tasks.    Time 4   Period Weeks   Status On-going                  Plan - 12/11/16 1504    Clinical Impression Statement A: Pt reports she is using her right hand more for picking items up and rolling her hair. Continued with passive stretching, added yellow theraputty grip and pinch strengthening task, working on wrist extension with flattening theraputty. Pt with improved supination range today. Verbal cuing for form intermittently.    Plan P: Measure grip and pinch strength if appropriate. Continue with passive stretching working to improve range within pain tolerance.    OT Home Exercise Plan 6/22: finger and wrist A/ROM   Consulted and Agree with Plan of Care Patient      Patient will benefit from skilled therapeutic intervention in order to improve the following deficits and impairments:  Impaired flexibility, Decreased strength, Decreased activity tolerance, Pain, Increased edema, Decreased range of motion, Increased fascial restricitons, Impaired UE functional use  Visit Diagnosis: Pain in right wrist  Stiffness of right wrist, not elsewhere classified  Other symptoms and signs involving the musculoskeletal system    Problem List There are no active problems to display for this patient.  [name], OTR/L  [phone] 12/11/2016, 3:06 PM  [place] [address] [place], [place], [address] Phone: [phone]   Fax:  [phone]  Name: MARNISHA STAMPLEY MRN: [id number] Date of Birth: [birth year]

## 2016-12-16 ENCOUNTER — Ambulatory Visit (HOSPITAL_COMMUNITY): Payer: Medicare Other | Admitting: Occupational Therapy

## 2016-12-16 DIAGNOSIS — M25531 Pain in right wrist: Secondary | ICD-10-CM

## 2016-12-16 DIAGNOSIS — M25631 Stiffness of right wrist, not elsewhere classified: Secondary | ICD-10-CM

## 2016-12-16 DIAGNOSIS — R29898 Other symptoms and signs involving the musculoskeletal system: Secondary | ICD-10-CM

## 2016-12-17 ENCOUNTER — Encounter (HOSPITAL_COMMUNITY): Payer: Self-pay | Admitting: Occupational Therapy

## 2016-12-17 NOTE — Therapy (Addendum)
[place], [place], [address] Phone: [phone]   Fax:  [phone]  Occupational Therapy Treatment  Patient Details  Name: Karen Nunez MRN: [id number] Date of Birth: [birth year] Referring Provider: Dr. Elsie Saas  Encounter Date: 12/16/2016      OT End of Session - 12/17/16 0813    Visit Number 6   Number of Visits 8   Date for OT Re-Evaluation 12/27/16   Authorization Type [employer]   Authorization Time Period Before 10th visit   Authorization - Visit Number 6   Authorization - Number of Visits 10   OT Start Time 1519   OT Stop Time 1613   OT Time Calculation (min) 54 min   Activity Tolerance Patient tolerated treatment well   Behavior During Therapy [facility] for tasks assessed/performed      Past Medical History:  Diagnosis Date  . No pertinent past medical history     Past Surgical History:  Procedure Laterality Date  . ABDOMINAL HYSTERECTOMY    . Bilateral breast lumpectomies     all benign  . COLONOSCOPY  09/25/2011   Procedure: COLONOSCOPY;  Surgeon: Danie Binder, MD;  [facility];  Service: Endoscopy;  Laterality: N/A;  10:30 AM    There were no vitals filed for this visit.      Subjective Assessment - 12/16/16 1519    Subjective  S: The PA said to keep working on it.    Currently in Pain? Yes   Pain Score 1    Pain Location Wrist   Pain Orientation Right   Pain Descriptors / Indicators Aching;Sore   Pain Type Acute pain   Pain Radiating Towards none   Pain Onset More than a month ago   Pain Frequency Intermittent   Aggravating Factors  movement   Pain Relieving Factors ice, pain medication   Effect of Pain on Daily Activities mod effect on ADL completion   Multiple Pain Sites No            [facility] OT Assessment - 12/17/16 0810      Assessment   Diagnosis Right distal radius fracture     Precautions   Precautions None     Hand Function   Right Hand  Gross Grasp Impaired   Right Hand Grip (lbs) 10   Right Hand Lateral Pinch 2 lbs   Right Hand 3 Point Pinch 1 lbs   Left Hand Grip (lbs) 40   Left Hand Lateral Pinch 10 lbs   Left 3 point pinch 10 lbs                  OT Treatments/Exercises (OP) - 12/17/16 0811      Exercises   Exercises Wrist;Hand     Wrist Exercises   Forearm Supination PROM;10 reps;AROM;15 reps   Forearm Pronation PROM;10 reps;AROM;15 reps   Wrist Flexion PROM;10 reps;AROM;15 reps   Wrist Extension PROM;10 reps;AROM;15 reps   Wrist Radial Deviation PROM;10 reps;AROM;15 reps   Wrist Ulnar Deviation PROM;10 reps;AROM;15 reps     Additional Wrist Exercises   Sponges 12, 17   Theraputty - Flatten yellow   Theraputty - Roll yellow   Theraputty - Grip yellow   Theraputty - Pinch yellow-3 point and lateral pinch     Hand Exercises   MCPJ Flexion PROM;10 reps   MCPJ Extension PROM;10 reps   PIPJ Flexion PROM;10 reps   PIPJ Extension PROM;10 reps   DIPJ  Flexion PROM;10 reps   DIPJ Extension PROM;10 reps   Other Hand Exercises Digit composite flexion/extension, 10X, A/ROM     Modalities   Modalities Paraffin     RUE Paraffin   Number Minutes Paraffin 10 Minutes   RUE Paraffin Location Hand     Manual Therapy   Manual Therapy Myofascial release   Manual therapy comments completed separately from therapeutic exercises   Myofascial Release Myofascial release to right hand, wrist, volar and dorsal forearm to decrease pain and fascial restrictions and increase joint range of motion.                   OT Short Term Goals - 12/17/16 0816      OT SHORT TERM GOAL #1   Title Pt will be provided with and educated on HEP to improve mobility required for improved use of RUE as dominant.    Time 4   Period Weeks   Status On-going     OT SHORT TERM GOAL #2   Title Pt will decrease pain to 2/10 or less in RUE to improve ability to use RUE during B/IADL completion.    Time 4   Period Weeks    Status On-going     OT SHORT TERM GOAL #3   Title Pt will decrease RUE fascial restrictions to minimal amounts to improve mobility required for grooming tasks.    Time 4   Period Weeks   Status On-going     OT SHORT TERM GOAL #4   Title Pt will improve right wrist and finger A/ROM to WNL to improve ability to roll hair using RUE as dominant.    Time 4   Period Weeks   Status On-going     OT SHORT TERM GOAL #5   Title Pt will improve right wrist strength to 4+/5 to improve ability to complete meal preparation tasks.    Time 4   Period Weeks   Status On-going     Additional Short Term Goals   Additional Short Term Goals Yes     OT SHORT TERM GOAL #6   Title Pt will improve R grip strength by 15# and pinch strength by 3# to improve ability to grasp and hold household items.    Time 4   Period Weeks   Status New                  Plan - 12/17/16 0813    Clinical Impression Statement A: Pt reports she saw the PA who told her to continue with therapy. Pt with notable improvement in A/ROM of MP/IP joints this session during sustained passive stretching, also with wrist flexion. Grip and pinch assessed this session and goals made accordingly. Pt reports she is using her hand as normally as possible during the day.    Plan P: Provide flexion glove when it arrives. Continue with passive stretching and begin grip/pinch strengthening as appropriate   OT Home Exercise Plan 6/22: finger and wrist A/ROM   Consulted and Agree with Plan of Care Patient      Patient will benefit from skilled therapeutic intervention in order to improve the following deficits and impairments:  Impaired flexibility, Decreased strength, Decreased activity tolerance, Pain, Increased edema, Decreased range of motion, Increased fascial restricitons, Impaired UE functional use  Visit Diagnosis: Pain in right wrist  Stiffness of right wrist, not elsewhere classified  Other symptoms and signs involving  the musculoskeletal system    Problem List There  are no active problems to display for this patient.  [name], OTR/L  [phone] 12/17/2016, 8:20 AM  [facility] [address] [place], [place], [address] Phone: [phone]   Fax:  [phone]  Name: Karen Nunez MRN: [id number] Date of Birth: 1945/07/09

## 2016-12-18 ENCOUNTER — Ambulatory Visit (HOSPITAL_COMMUNITY): Payer: Medicare Other | Admitting: Occupational Therapy

## 2016-12-18 ENCOUNTER — Encounter (HOSPITAL_COMMUNITY): Payer: Self-pay | Admitting: Occupational Therapy

## 2016-12-18 DIAGNOSIS — R29898 Other symptoms and signs involving the musculoskeletal system: Secondary | ICD-10-CM

## 2016-12-18 DIAGNOSIS — M25631 Stiffness of right wrist, not elsewhere classified: Secondary | ICD-10-CM

## 2016-12-18 DIAGNOSIS — M25531 Pain in right wrist: Secondary | ICD-10-CM | POA: Diagnosis not present

## 2016-12-18 NOTE — Therapy (Signed)
[place], [place], [address] Phone: [phone]   Fax:  [phone]  Occupational Therapy Treatment  Patient Details  Name: Karen Nunez MRN: [id number] Date of Birth: 1945-11-21 Referring Provider: Dr. Elsie Saas  Encounter Date: 12/18/2016      OT End of Session - 12/18/16 1616    Visit Number 7   Number of Visits 8   Date for OT Re-Evaluation 12/27/16   Authorization Type [employer]   Authorization Time Period Before 10th visit   Authorization - Visit Number 7   Authorization - Number of Visits 10   OT Start Time 1520   OT Stop Time 1610   OT Time Calculation (min) 50 min   Activity Tolerance Patient tolerated treatment well   Behavior During Therapy [facility] for tasks assessed/performed      Past Medical History:  Diagnosis Date  . No pertinent past medical history     Past Surgical History:  Procedure Laterality Date  . ABDOMINAL HYSTERECTOMY    . Bilateral breast lumpectomies     all benign  . COLONOSCOPY  09/25/2011   Procedure: COLONOSCOPY;  Surgeon: Danie Binder, MD;  [facility];  Service: Endoscopy;  Laterality: N/A;  10:30 AM    There were no vitals filed for this visit.      Subjective Assessment - 12/18/16 1525    Subjective  S: I've been holding those stretches for longer and it's helping.    Currently in Pain? No/denies            [facility] OT Assessment - 12/18/16 1524      Assessment   Diagnosis Right distal radius fracture     Precautions   Precautions None                  OT Treatments/Exercises (OP) - 12/18/16 1525      Exercises   Exercises Wrist;Hand     Wrist Exercises   Forearm Supination PROM;5 reps;AROM;15 reps   Forearm Pronation PROM;5 reps;AROM;15 reps   Wrist Flexion PROM;5 reps;AROM;15 reps   Wrist Extension PROM;5 reps;AROM;15 reps   Wrist Radial Deviation PROM;5 reps;AROM;15 reps   Wrist Ulnar Deviation PROM;5  reps;AROM;15 reps     Additional Wrist Exercises   Theraputty - Flatten yellow   Theraputty - Roll yellow   Theraputty - Grip yellow   Theraputty - Pinch yellow-3 point and lateral pinch   Hand Gripper with Large Beads all beads gripper at 7#   Hand Gripper with Medium Beads all beads gripper at 7#     Hand Exercises   MCPJ Flexion PROM;5 reps   MCPJ Extension PROM;5 reps   PIPJ Flexion PROM;5 reps   PIPJ Extension PROM;5 reps   DIPJ Flexion PROM;5 reps   DIPJ Extension PROM;5 reps   Digiticizer 3.0# 10X   Other Hand Exercises Digit composite flexion/extension, 10X, A/ROM   Other Hand Exercises Pt used yellow clothespin and 3 point pinch to place 15 sponges in bucket. Pt then used red clothespin with lateral pinch to grasp and place 15 sponges in bucket     Modalities   Modalities Paraffin     RUE Paraffin   Number Minutes Paraffin 10 Minutes   RUE Paraffin Location Hand     Manual Therapy   Manual Therapy Myofascial release   Manual therapy comments completed separately from therapeutic exercises   Myofascial Release Myofascial release to right hand, wrist, volar and  dorsal forearm to decrease pain and fascial restrictions and increase joint range of motion.                   OT Short Term Goals - 12/18/16 1619      OT SHORT TERM GOAL #1   Title Pt will be provided with and educated on HEP to improve mobility required for improved use of RUE as dominant.    Time 4   Period Weeks   Status On-going     OT SHORT TERM GOAL #2   Title Pt will decrease pain to 2/10 or less in RUE to improve ability to use RUE during B/IADL completion.    Time 4   Period Weeks   Status On-going     OT SHORT TERM GOAL #3   Title Pt will decrease RUE fascial restrictions to minimal amounts to improve mobility required for grooming tasks.    Time 4   Period Weeks   Status On-going     OT SHORT TERM GOAL #4   Title Pt will improve right wrist and finger A/ROM to WNL to improve  ability to roll hair using RUE as dominant.    Time 4   Period Weeks   Status On-going     OT SHORT TERM GOAL #5   Title Pt will improve right wrist strength to 4+/5 to improve ability to complete meal preparation tasks.    Time 4   Period Weeks   Status On-going     OT SHORT TERM GOAL #6   Title Pt will improve R grip strength by 15# and pinch strength by 3# to improve ability to grasp and hold household items.    Time 4   Period Weeks   Status On-going                  Plan - 12/18/16 1616    Clinical Impression Statement A: Pt comes in with notable improvement in P/ROM and A/ROM, continues to have stiffness limiting ROM at MP joints, unable to make complete fist. Began light grip strengthening this session, as well as continuing with pinch strengthening. Pt reports HEP is going well, verbal cuing for speed during exercises this session.    Plan P: Provide flexion glove when it arrives. Continue with passive stretching working to improve ROM within pain limitations.    OT Home Exercise Plan 6/22: finger and wrist A/ROM   Consulted and Agree with Plan of Care Patient      Patient will benefit from skilled therapeutic intervention in order to improve the following deficits and impairments:  Impaired flexibility, Decreased strength, Decreased activity tolerance, Pain, Increased edema, Decreased range of motion, Increased fascial restricitons, Impaired UE functional use  Visit Diagnosis: Pain in right wrist  Stiffness of right wrist, not elsewhere classified  Other symptoms and signs involving the musculoskeletal system    Problem List There are no active problems to display for this patient.  [name], OTR/L  [phone] 12/18/2016, 4:20 PM  [facility] [address] [place], [place], [address] Phone: [phone]   Fax:  [phone]  Name: Karen Nunez MRN: [id number] Date of Birth: 1945/06/19

## 2016-12-22 ENCOUNTER — Encounter (HOSPITAL_COMMUNITY): Payer: Self-pay | Admitting: Occupational Therapy

## 2016-12-22 ENCOUNTER — Ambulatory Visit (HOSPITAL_COMMUNITY): Payer: Medicare Other | Admitting: Occupational Therapy

## 2016-12-22 DIAGNOSIS — M25531 Pain in right wrist: Secondary | ICD-10-CM

## 2016-12-22 DIAGNOSIS — R29898 Other symptoms and signs involving the musculoskeletal system: Secondary | ICD-10-CM

## 2016-12-22 DIAGNOSIS — M25631 Stiffness of right wrist, not elsewhere classified: Secondary | ICD-10-CM

## 2016-12-22 NOTE — Therapy (Signed)
[place], [place], [address] Phone: [phone]   Fax:  [phone]  Occupational Therapy Treatment  Patient Details  Name: Karen Nunez MRN: [id number] Date of Birth: Jun 09, 1945 Referring Provider: Dr. Elsie Saas  Encounter Date: 12/22/2016      OT End of Session - 12/22/16 1608    Visit Number 8   Number of Visits 9   Date for OT Re-Evaluation 12/27/16   Authorization Type [employer]   Authorization Time Period Before 10th visit   Authorization - Visit Number 8   Authorization - Number of Visits 10   OT Start Time 2778   OT Stop Time 1604   OT Time Calculation (min) 49 min   Activity Tolerance Patient tolerated treatment well   Behavior During Therapy [facility] for tasks assessed/performed      Past Medical History:  Diagnosis Date  . No pertinent past medical history     Past Surgical History:  Procedure Laterality Date  . ABDOMINAL HYSTERECTOMY    . Bilateral breast lumpectomies     all benign  . COLONOSCOPY  09/25/2011   Procedure: COLONOSCOPY;  Surgeon: Danie Binder, MD;  [facility];  Service: Endoscopy;  Laterality: N/A;  10:30 AM    There were no vitals filed for this visit.      Subjective Assessment - 12/22/16 1522    Subjective  S: I'm able to hold more things now, like coffee cups.    Currently in Pain? Yes   Pain Score 2    Pain Location Wrist   Pain Orientation Right   Pain Descriptors / Indicators Aching;Sore   Pain Type Acute pain   Pain Radiating Towards none   Pain Onset More than a month ago   Pain Frequency Intermittent   Aggravating Factors  movement   Pain Relieving Factors ice, pain medication   Effect of Pain on Daily Activities mod effect on ADL completion   Multiple Pain Sites No            [facility] OT Assessment - 12/22/16 1522      Assessment   Diagnosis Right distal radius fracture     Precautions   Precautions None                   OT Treatments/Exercises (OP) - 12/22/16 1523      Exercises   Exercises Wrist;Hand     Wrist Exercises   Forearm Supination PROM;5 reps;AROM;15 reps   Forearm Pronation PROM;5 reps;AROM;15 reps   Wrist Flexion PROM;5 reps;AROM;15 reps   Wrist Extension PROM;5 reps;AROM;15 reps   Wrist Radial Deviation PROM;5 reps;AROM;15 reps   Wrist Ulnar Deviation PROM;5 reps;AROM;15 reps     Additional Wrist Exercises   Hand Gripper with Large Beads all beads gripper at 11#   Hand Gripper with Medium Beads all beads gripper at 11#     Hand Exercises   MCPJ Flexion PROM;5 reps   MCPJ Extension PROM;5 reps   PIPJ Flexion PROM;5 reps   PIPJ Extension PROM;5 reps   DIPJ Flexion PROM;5 reps   DIPJ Extension PROM;5 reps   Joint Blocking Exercises DIP and PIP, 10X each   Other Hand Exercises Finger taps, 10X     Modalities   Modalities Paraffin     RUE Paraffin   Number Minutes Paraffin 15 Minutes   RUE Paraffin Location Hand     Manual Therapy   Manual Therapy Myofascial release  Manual therapy comments completed separately from therapeutic exercises   Myofascial Release Myofascial release to right hand, wrist, volar and dorsal forearm to decrease pain and fascial restrictions and increase joint range of motion.                   OT Short Term Goals - 12/18/16 1619      OT SHORT TERM GOAL #1   Title Pt will be provided with and educated on HEP to improve mobility required for improved use of RUE as dominant.    Time 4   Period Weeks   Status On-going     OT SHORT TERM GOAL #2   Title Pt will decrease pain to 2/10 or less in RUE to improve ability to use RUE during B/IADL completion.    Time 4   Period Weeks   Status On-going     OT SHORT TERM GOAL #3   Title Pt will decrease RUE fascial restrictions to minimal amounts to improve mobility required for grooming tasks.    Time 4   Period Weeks   Status On-going     OT SHORT TERM GOAL #4    Title Pt will improve right wrist and finger A/ROM to WNL to improve ability to roll hair using RUE as dominant.    Time 4   Period Weeks   Status On-going     OT SHORT TERM GOAL #5   Title Pt will improve right wrist strength to 4+/5 to improve ability to complete meal preparation tasks.    Time 4   Period Weeks   Status On-going     OT SHORT TERM GOAL #6   Title Pt will improve R grip strength by 15# and pinch strength by 3# to improve ability to grasp and hold household items.    Time 4   Period Weeks   Status On-going                  Plan - 12/22/16 1609    Clinical Impression Statement A: Pt reports she is using the right hand more at home in eating tasks and when grasping objects. OT able to achieve full ROM at IP joints during passive stretching, continues to be limited at MPs. Increased gripper resistance today, working on IP flexion and grip strength. Verbal cuing to slow down during exercises this session.    Plan P: Reassessment, recertification, update G-code. provide flexion glove when it arrives, continue with passive stretching working on ROM improvements. Complete pinch task   OT Home Exercise Plan 6/22: finger and wrist A/ROM   Consulted and Agree with Plan of Care Patient      Patient will benefit from skilled therapeutic intervention in order to improve the following deficits and impairments:  Impaired flexibility, Decreased strength, Decreased activity tolerance, Pain, Increased edema, Decreased range of motion, Increased fascial restricitons, Impaired UE functional use  Visit Diagnosis: Pain in right wrist  Stiffness of right wrist, not elsewhere classified  Other symptoms and signs involving the musculoskeletal system    Problem List There are no active problems to display for this patient.  [name], OTR/L  [phone] 12/22/2016, 4:12 PM  [facility] [address] [place], [place],  [address] Phone: [phone]   Fax:  [phone]  Name: Karen Nunez MRN: [id number] Date of Birth: 09/03/1945

## 2016-12-24 ENCOUNTER — Ambulatory Visit (HOSPITAL_COMMUNITY): Payer: Medicare Other | Admitting: Occupational Therapy

## 2016-12-24 ENCOUNTER — Encounter (HOSPITAL_COMMUNITY): Payer: Self-pay | Admitting: Occupational Therapy

## 2016-12-24 DIAGNOSIS — M25531 Pain in right wrist: Secondary | ICD-10-CM

## 2016-12-24 DIAGNOSIS — R29898 Other symptoms and signs involving the musculoskeletal system: Secondary | ICD-10-CM

## 2016-12-24 DIAGNOSIS — M25631 Stiffness of right wrist, not elsewhere classified: Secondary | ICD-10-CM

## 2016-12-24 NOTE — Therapy (Signed)
[place], [place], [address] Phone: [phone]   Fax:  [phone]  Occupational Therapy Reassessment, Treatment (Recertification)  Patient Details  Name: Karen Nunez MRN: [id number] Date of Birth: 05-30-1946 Referring Provider: Dr. Elsie Saas  Encounter Date: 12/24/2016      OT End of Session - 12/24/16 1544    Visit Number 9   Number of Visits 18   Date for OT Re-Evaluation 01/23/17   Authorization Type [employer]   Authorization Time Period Before 19th visit   Authorization - Visit Number 9   Authorization - Number of Visits 19   OT Start Time 1432   OT Stop Time 1526   OT Time Calculation (min) 54 min   Activity Tolerance Patient tolerated treatment well   Behavior During Therapy [facility] for tasks assessed/performed      Past Medical History:  Diagnosis Date  . No pertinent past medical history     Past Surgical History:  Procedure Laterality Date  . ABDOMINAL HYSTERECTOMY    . Bilateral breast lumpectomies     all benign  . COLONOSCOPY  09/25/2011   Procedure: COLONOSCOPY;  Surgeon: Danie Binder, MD;  [facility];  Service: Endoscopy;  Laterality: N/A;  10:30 AM    There were no vitals filed for this visit.      Subjective Assessment - 12/24/16 1436    Subjective  S: I drove here today.    Currently in Pain? No/denies            [facility] OT Assessment - 12/24/16 1435      Assessment   Diagnosis Right distal radius fracture     Precautions   Precautions None     Palpation   Palpation comment Min/mod fascial restrictions along thenar eminence, wrist, dorsal and volar forearm     AROM   AROM Assessment Site Forearm;Wrist   Right/Left Forearm Right   Right Forearm Pronation 90 Degrees  same as previous   Right Forearm Supination 75 Degrees  60 previous   Right Wrist Extension 30 Degrees  14 previous   Right Wrist Flexion 42 Degrees  30 previous   Right  Wrist Radial Deviation 18 Degrees  10 previous   Right Wrist Ulnar Deviation 22 Degrees  20 previous     PROM   PROM Assessment Site Forearm;Wrist   Right/Left Forearm Right   Right Forearm Pronation 90 Degrees  same as previous   Right Forearm Supination 80 Degrees  60 previous   Right Wrist Extension 52 Degrees  23 previous   Right Wrist Flexion 50 Degrees  40 previous   Right Wrist Radial Deviation 25 Degrees  15 previous   Right Wrist Ulnar Deviation 25 Degrees  20 previous     Strength   Strength Assessment Site Forearm;Wrist   Right/Left Forearm Right   Right Forearm Pronation 3+/5  3-/5 previous   Right Forearm Supination 3/5  3-/5 previous   Right Wrist Flexion 4-/5  2+/5 previous   Right Wrist Extension 4-/5  2+/5 previous   Right Wrist Radial Deviation 4-/5  3/5 previous   Right Wrist Ulnar Deviation 4-/5  3/5 previous     Right Hand AROM   R Index  MCP 0-90 58 Degrees  same as previous   R Index PIP 0-100 74 Degrees  48 previous   R Index DIP 0-70 43 Degrees  30 previous   R Long  MCP 0-90 60  Degrees  54 previous   R Long PIP 0-100 74 Degrees  54 previous   R Long DIP 0-70 70 Degrees  35 previous   R Ring  MCP 0-90 52 Degrees  48 previous   R Ring PIP 0-100 84 Degrees  60 previous   R Ring DIP 0-70 70 Degrees  40 previous   R Little  MCP 0-90 50 Degrees  42 previous   R Little PIP 0-100 78 Degrees  60 previous   R Little DIP 0-70 76 Degrees  50 previous     Hand Function   Right Hand Gross Grasp Impaired   Right Hand Grip (lbs) 15  10 previous   Right Hand Lateral Pinch 5 lbs  2 previous   Right Hand 3 Point Pinch 3 lbs  1 previous                  OT Treatments/Exercises (OP) - 12/24/16 1436      Exercises   Exercises Wrist;Hand     Wrist Exercises   Forearm Supination PROM;5 reps;AROM;15 reps   Forearm Pronation PROM;5 reps;AROM;15 reps   Wrist Flexion PROM;5 reps;AROM;15 reps   Wrist Extension PROM;5  reps;AROM;15 reps   Wrist Radial Deviation PROM;5 reps;AROM;15 reps   Wrist Ulnar Deviation PROM;5 reps;AROM;15 reps     Hand Exercises   MCPJ Flexion PROM;5 reps   MCPJ Extension PROM;5 reps   PIPJ Flexion PROM;5 reps   PIPJ Extension PROM;5 reps   DIPJ Flexion PROM;5 reps   DIPJ Extension PROM;5 reps     Modalities   Modalities Paraffin     RUE Paraffin   Number Minutes Paraffin 10 Minutes   RUE Paraffin Location Hand     Manual Therapy   Manual Therapy Myofascial release   Manual therapy comments completed separately from therapeutic exercises   Myofascial Release Myofascial release to right hand, wrist, volar and dorsal forearm to decrease pain and fascial restrictions and increase joint range of motion.                   OT Short Term Goals - 12/24/16 1544      OT SHORT TERM GOAL #1   Title Pt will be provided with and educated on HEP to improve mobility required for improved use of RUE as dominant.    Time 4   Period Weeks   Status Achieved     OT SHORT TERM GOAL #2   Title Pt will decrease pain to 2/10 or less in RUE to improve ability to use RUE during B/IADL completion.    Time 4   Period Weeks     OT SHORT TERM GOAL #3   Title Pt will decrease RUE fascial restrictions to minimal amounts to improve mobility required for grooming tasks.    Time 4   Period Weeks   Status Achieved     OT SHORT TERM GOAL #4   Title Pt will improve right wrist and finger A/ROM to WNL to improve ability to roll hair using RUE as dominant.    Time 4   Period Weeks   Status On-going     OT SHORT TERM GOAL #5   Title Pt will improve right wrist strength to 4+/5 to improve ability to complete meal preparation tasks.    Time 4   Period Weeks   Status On-going     OT SHORT TERM GOAL #6   Title Pt will improve R grip strength by 20#  and pinch strength by 5# to improve ability to grasp and hold household items.    Time 4   Period Weeks   Status Revised                   Plan - 2017/01/08 1545    Clinical Impression Statement A: Reassessment completed this session, pt has met 2 goals and is making progress with ROM, strength, and functional use of RUE. Pt reports she is able to wash dishes and hold a cup of coffee now, she is trying to use the RUE as normally as possible. Pt continues to experience stiffness in MP and IP joints, as well as strength deficits, pt would benefit from continued skilled OT services for 4 additional weeks to further improve ROM, strength, and functional use of RUE as dominant. Pt provided with flexion glove this session and educated on fit and wearing schedule.    OT Frequency 2x / week   OT Duration 4 weeks   OT Treatment/Interventions Self-care/ADL training;Therapeutic exercise;Patient/family education;Ultrasound;Splinting;Manual Therapy;Therapeutic activities;DME and/or AE instruction;Cryotherapy;Electrical Stimulation;Moist Heat;Passive range of motion   Plan P: Continue with passive stretching, ROM, and strengthening exercises. Follow up on flexion glove use.       Patient will benefit from skilled therapeutic intervention in order to improve the following deficits and impairments:  Impaired flexibility, Decreased strength, Decreased activity tolerance, Pain, Increased edema, Decreased range of motion, Increased fascial restricitons, Impaired UE functional use  Visit Diagnosis: Pain in right wrist  Stiffness of right wrist, not elsewhere classified  Other symptoms and signs involving the musculoskeletal system      G-Codes - 01/08/2017 1549    Functional Assessment Tool Used (Outpatient only) clinical judgement   Functional Limitation Carrying, moving and handling objects   Carrying, Moving and Handling Objects Current Status (T9030) At least 40 percent but less than 60 percent impaired, limited or restricted   Carrying, Moving and Handling Objects Goal Status (S9233) At least 1 percent but less than 20  percent impaired, limited or restricted      Problem List There are no active problems to display for this patient.  [name], OTR/L  [phone] 01-08-2017, 3:49 PM  [facility] [address] [place], [place], [address] Phone: [phone]   Fax:  [phone]  Name: Karen Nunez MRN: [id number] Date of Birth: 02/06/1946

## 2016-12-29 ENCOUNTER — Encounter (HOSPITAL_COMMUNITY): Payer: Self-pay | Admitting: Occupational Therapy

## 2016-12-29 ENCOUNTER — Ambulatory Visit (HOSPITAL_COMMUNITY): Payer: Medicare Other | Admitting: Occupational Therapy

## 2016-12-29 DIAGNOSIS — M25531 Pain in right wrist: Secondary | ICD-10-CM | POA: Diagnosis not present

## 2016-12-29 DIAGNOSIS — R29898 Other symptoms and signs involving the musculoskeletal system: Secondary | ICD-10-CM

## 2016-12-29 DIAGNOSIS — M25631 Stiffness of right wrist, not elsewhere classified: Secondary | ICD-10-CM

## 2016-12-29 NOTE — Therapy (Signed)
[place], [place], [address] Phone: [phone]   Fax:  [phone]  Occupational Therapy Treatment  Patient Details  Name: Karen Nunez MRN: [id number] Date of Birth: July 13, 1945 Referring Provider: Dr. Elsie Saas  Encounter Date: 12/29/2016      OT End of Session - 12/29/16 1613    Visit Number 10   Number of Visits 18   Date for OT Re-Evaluation 01/23/17   Authorization Type [employer]   Authorization Time Period Before 19th visit   Authorization - Visit Number 10   Authorization - Number of Visits 19   OT Start Time 0175   OT Stop Time 1602   OT Time Calculation (min) 45 min   Activity Tolerance Patient tolerated treatment well   Behavior During Therapy [facility] for tasks assessed/performed      Past Medical History:  Diagnosis Date  . No pertinent past medical history     Past Surgical History:  Procedure Laterality Date  . ABDOMINAL HYSTERECTOMY    . Bilateral breast lumpectomies     all benign  . COLONOSCOPY  09/25/2011   Procedure: COLONOSCOPY;  Surgeon: Danie Binder, MD;  [facility];  Service: Endoscopy;  Laterality: N/A;  10:30 AM    There were no vitals filed for this visit.      Subjective Assessment - 12/29/16 1519    Subjective  S: I wore the glove for the full time.    Currently in Pain? No/denies            [facility] OT Assessment - 12/29/16 1519      Assessment   Diagnosis Right distal radius fracture     Precautions   Precautions None                  OT Treatments/Exercises (OP) - 12/29/16 1520      Exercises   Exercises Wrist;Hand     Wrist Exercises   Forearm Supination PROM;5 reps;AROM;15 reps   Forearm Pronation PROM;5 reps;AROM;15 reps   Wrist Flexion PROM;5 reps;AROM;15 reps   Wrist Extension PROM;5 reps;AROM;15 reps   Wrist Radial Deviation PROM;5 reps;AROM;15 reps   Wrist Ulnar Deviation PROM;5 reps;AROM;15 reps     Additional Wrist Exercises   Theraputty Flatten   Theraputty - Flatten red   Hand Gripper with Large Beads all beads gripper at 20#   Hand Gripper with Medium Beads all beads gripper at 20#   Hand Gripper with Small Beads all beads gripper at 15#     Hand Exercises   MCPJ Flexion PROM;5 reps   MCPJ Extension PROM;5 reps   PIPJ Flexion PROM;5 reps   PIPJ Extension PROM;5 reps   DIPJ Flexion PROM;5 reps   DIPJ Extension PROM;5 reps   Other Hand Exercises velcro board-wrist flexion, extension, and forearm supination 5X each     Manual Therapy   Manual Therapy Myofascial release   Manual therapy comments completed separately from therapeutic exercises   Myofascial Release Myofascial release to right hand, wrist, volar and dorsal forearm to decrease pain and fascial restrictions and increase joint range of motion.                   OT Short Term Goals - 12/24/16 1544      OT SHORT TERM GOAL #1   Title Pt will be provided with and educated on HEP to improve mobility required for improved use of RUE as dominant.  Time 4   Period Weeks   Status Achieved     OT SHORT TERM GOAL #2   Title Pt will decrease pain to 2/10 or less in RUE to improve ability to use RUE during B/IADL completion.    Time 4   Period Weeks     OT SHORT TERM GOAL #3   Title Pt will decrease RUE fascial restrictions to minimal amounts to improve mobility required for grooming tasks.    Time 4   Period Weeks   Status Achieved     OT SHORT TERM GOAL #4   Title Pt will improve right wrist and finger A/ROM to WNL to improve ability to roll hair using RUE as dominant.    Time 4   Period Weeks   Status On-going     OT SHORT TERM GOAL #5   Title Pt will improve right wrist strength to 4+/5 to improve ability to complete meal preparation tasks.    Time 4   Period Weeks   Status On-going     OT SHORT TERM GOAL #6   Title Pt will improve R grip strength by 20# and pinch strength by 5# to improve  ability to grasp and hold household items.    Time 4   Period Weeks   Status Revised                  Plan - 12/29/16 1613    Clinical Impression Statement A: Pt reports she is wearing the flexion glove 3x/day for 30 minutes each time, presents with notable improvement in IP and MP range of motion today. Increased gripper resistance to 20# for large and medium beads, add small beads. Pt required verbal cuing for technique and speed with exercises.    Plan P: Continue with passive stretching, add 1# weight to wrist ROM exercises   OT Home Exercise Plan 6/22: finger and wrist A/ROM   Consulted and Agree with Plan of Care Patient      Patient will benefit from skilled therapeutic intervention in order to improve the following deficits and impairments:  Impaired flexibility, Decreased strength, Decreased activity tolerance, Pain, Increased edema, Decreased range of motion, Increased fascial restricitons, Impaired UE functional use  Visit Diagnosis: Pain in right wrist  Stiffness of right wrist, not elsewhere classified  Other symptoms and signs involving the musculoskeletal system    Problem List There are no active problems to display for this patient.  [name], OTR/L  [phone] 12/29/2016, 4:15 PM  [facility] [address] [place], [place], [address] Phone: [phone]   Fax:  [phone]  Name: ELVIA AYDIN MRN: [id number] Date of Birth: [birth year]

## 2016-12-31 ENCOUNTER — Ambulatory Visit (HOSPITAL_COMMUNITY): Payer: Medicare Other | Admitting: Occupational Therapy

## 2016-12-31 ENCOUNTER — Encounter (HOSPITAL_COMMUNITY): Payer: Self-pay | Admitting: Occupational Therapy

## 2016-12-31 DIAGNOSIS — M25531 Pain in right wrist: Secondary | ICD-10-CM

## 2016-12-31 DIAGNOSIS — R29898 Other symptoms and signs involving the musculoskeletal system: Secondary | ICD-10-CM

## 2016-12-31 DIAGNOSIS — M25631 Stiffness of right wrist, not elsewhere classified: Secondary | ICD-10-CM

## 2016-12-31 NOTE — Therapy (Signed)
[place], [place], [address] Phone: [phone]   Fax:  [phone]  Occupational Therapy Treatment  Patient Details  Name: Karen Nunez MRN: [id number] Date of Birth: 1945-06-21 Referring Provider: Dr. Elsie Saas  Encounter Date: 12/31/2016      OT End of Session - 12/31/16 1601    Visit Number 11   Number of Visits 18   Date for OT Re-Evaluation 01/23/17   Authorization Type [employer]   Authorization Time Period Before 19th visit   Authorization - Visit Number 11   Authorization - Number of Visits 19   OT Start Time 1514   OT Stop Time 1600   OT Time Calculation (min) 46 min   Activity Tolerance Patient tolerated treatment well   Behavior During Therapy [facility] for tasks assessed/performed      Past Medical History:  Diagnosis Date  . No pertinent past medical history     Past Surgical History:  Procedure Laterality Date  . ABDOMINAL HYSTERECTOMY    . Bilateral breast lumpectomies     all benign  . COLONOSCOPY  09/25/2011   Procedure: COLONOSCOPY;  Surgeon: Danie Binder, MD;  [facility];  Service: Endoscopy;  Laterality: N/A;  10:30 AM    There were no vitals filed for this visit.      Subjective Assessment - 12/31/16 1514    Subjective  S: I really worked this wrist last night.    Currently in Pain? No/denies            [facility] OT Assessment - 12/31/16 1514      Assessment   Diagnosis Right distal radius fracture     Precautions   Precautions None                  OT Treatments/Exercises (OP) - 12/31/16 1516      Exercises   Exercises Wrist;Hand     Weighted Stretch Over Towel Roll   Wrist Extension - Weighted Stretch 1 pound;60 seconds     Wrist Exercises   Forearm Supination PROM;5 reps;Strengthening;15 reps   Bar Weights/Barbell (Forearm Supination) 1 lb   Forearm Pronation PROM;5 reps;Strengthening;15 reps   Bar Weights/Barbell (Forearm  Pronation) 1 lb   Wrist Flexion PROM;5 reps;Strengthening;15 reps   Bar Weights/Barbell (Wrist Flexion) 1 lb   Wrist Extension PROM;5 reps;Strengthening;15 reps   Bar Weights/Barbell (Wrist Extension) 1 lb   Wrist Radial Deviation PROM;5 reps;Strengthening;15 reps   Bar Weights/Barbell (Radial Deviation) 1 lb   Wrist Ulnar Deviation PROM;5 reps;Strengthening;15 reps   Bar Weights/Barbell (Ulnar Deviation) 1 lb     Additional Wrist Exercises   Hand Gripper with Large Beads all beads gripper at 22#   Hand Gripper with Medium Beads all beads gripper at 22#   Hand Gripper with Small Beads all beads gripper at 22#     Hand Exercises   MCPJ Flexion PROM;5 reps   MCPJ Extension PROM;5 reps   PIPJ Flexion PROM;5 reps   PIPJ Extension PROM;5 reps   DIPJ Flexion PROM;5 reps   DIPJ Extension PROM;5 reps   Other Hand Exercises velcro board-wrist flexion, extension, and forearm supination 5X each   Other Hand Exercises Pt used red clothespin and 3 point pinch to grasp and place 25 sponges into bucket. Min difficulty     Manual Therapy   Manual Therapy Myofascial release   Manual therapy comments completed separately from therapeutic exercises   Myofascial Release Myofascial  release to right hand, wrist, volar and dorsal forearm to decrease pain and fascial restrictions and increase joint range of motion.                 OT Education - 12/31/16 1604    Education provided Yes   Education Details upgraded HEP to wrist strengthening   Person(s) Educated Patient   Methods Explanation;Demonstration;Handout   Comprehension Verbalized understanding;Returned demonstration          OT Short Term Goals - 12/24/16 1544      OT SHORT TERM GOAL #1   Title Pt will be provided with and educated on HEP to improve mobility required for improved use of RUE as dominant.    Time 4   Period Weeks   Status Achieved     OT SHORT TERM GOAL #2   Title Pt will decrease pain to 2/10 or less in RUE  to improve ability to use RUE during B/IADL completion.    Time 4   Period Weeks     OT SHORT TERM GOAL #3   Title Pt will decrease RUE fascial restrictions to minimal amounts to improve mobility required for grooming tasks.    Time 4   Period Weeks   Status Achieved     OT SHORT TERM GOAL #4   Title Pt will improve right wrist and finger A/ROM to WNL to improve ability to roll hair using RUE as dominant.    Time 4   Period Weeks   Status On-going     OT SHORT TERM GOAL #5   Title Pt will improve right wrist strength to 4+/5 to improve ability to complete meal preparation tasks.    Time 4   Period Weeks   Status On-going     OT SHORT TERM GOAL #6   Title Pt will improve R grip strength by 20# and pinch strength by 5# to improve ability to grasp and hold household items.    Time 4   Period Weeks   Status Revised                  Plan - 12/31/16 1602    Clinical Impression Statement A: Pt reports she is using her wrist/hand more and it is beginning to feel "more normal." Added 1# weight to wrist exercises and added wrist extension stretch. Increased gripper resistance to 22# for all size beads and completed pinch strengthening with red clothespin. Educated pt on increasing HEP to strengthening, and working on wrist and digit extension with putty.    Plan P: Follow up on strengthening exercises, increase weight for extension stretch to 2#. Theraputty activity working on wrist and digit extension   OT Home Exercise Plan 6/22: finger and wrist A/ROM; 7/26: wrist strengthening   Consulted and Agree with Plan of Care Patient      Patient will benefit from skilled therapeutic intervention in order to improve the following deficits and impairments:  Impaired flexibility, Decreased strength, Decreased activity tolerance, Pain, Increased edema, Decreased range of motion, Increased fascial restricitons, Impaired UE functional use  Visit Diagnosis: Pain in right  wrist  Stiffness of right wrist, not elsewhere classified  Other symptoms and signs involving the musculoskeletal system    Problem List There are no active problems to display for this patient.  [name], OTR/L  [phone] 12/31/2016, 4:05 PM  [place] [address] [place], [place], [address] Phone: [phone]   Fax:  [phone]  Name: Karen Nunez MRN: [id number] Date of Birth: 1946-03-06

## 2017-01-11 ENCOUNTER — Ambulatory Visit (HOSPITAL_COMMUNITY): Payer: Medicare Other | Attending: Orthopedic Surgery

## 2017-01-11 ENCOUNTER — Encounter (HOSPITAL_COMMUNITY): Payer: Self-pay

## 2017-01-11 DIAGNOSIS — M25531 Pain in right wrist: Secondary | ICD-10-CM | POA: Diagnosis not present

## 2017-01-11 DIAGNOSIS — R29898 Other symptoms and signs involving the musculoskeletal system: Secondary | ICD-10-CM | POA: Diagnosis present

## 2017-01-11 DIAGNOSIS — M25631 Stiffness of right wrist, not elsewhere classified: Secondary | ICD-10-CM | POA: Insufficient documentation

## 2017-01-11 NOTE — Therapy (Signed)
[place], [place], [address] Phone: [phone]   Fax:  [phone]  Occupational Therapy Treatment  Patient Details  Name: Karen Nunez MRN: [id number] Date of Birth: Mar 08, 1946 Referring Provider: Dr. Elsie Saas  Encounter Date: 01/11/2017      OT End of Session - 01/11/17 1513    Visit Number 12   Number of Visits 18   Date for OT Re-Evaluation 01/23/17   Authorization Type [employer]   Authorization Time Period Before 19th visit   Authorization - Visit Number 12   Authorization - Number of Visits 19   OT Start Time 1432   OT Stop Time 1515   OT Time Calculation (min) 43 min   Activity Tolerance Patient tolerated treatment well   Behavior During Therapy [facility] for tasks assessed/performed      Past Medical History:  Diagnosis Date  . No pertinent past medical history     Past Surgical History:  Procedure Laterality Date  . ABDOMINAL HYSTERECTOMY    . Bilateral breast lumpectomies     all benign  . COLONOSCOPY  09/25/2011   Procedure: COLONOSCOPY;  Surgeon: Danie Binder, MD;  [facility];  Service: Endoscopy;  Laterality: N/A;  10:30 AM    There were no vitals filed for this visit.      Subjective Assessment - 01/11/17 1449    Subjective  S: I have been really pushing my exercises lately because I want to get better   Currently in Pain? No/denies            [facility] OT Assessment - 01/11/17 1449      Assessment   Diagnosis Right distal radius fracture     Precautions   Precautions None                  OT Treatments/Exercises (OP) - 01/11/17 1450      Exercises   Exercises Wrist;Hand     Weighted Stretch Over Towel Roll   Wrist Extension - Weighted Stretch 2 pounds;60 seconds     Wrist Exercises   Forearm Supination PROM;5 reps;Strengthening;15 reps   Bar Weights/Barbell (Forearm Supination) 1 lb   Forearm Pronation PROM;5 reps;Strengthening;15  reps   Bar Weights/Barbell (Forearm Pronation) 1 lb   Wrist Flexion PROM;5 reps;Strengthening;15 reps   Bar Weights/Barbell (Wrist Flexion) 1 lb   Wrist Extension PROM;5 reps;Strengthening;15 reps   Wrist Radial Deviation PROM;5 reps;Strengthening;15 reps   Bar Weights/Barbell (Radial Deviation) 1 lb   Wrist Ulnar Deviation PROM;5 reps;Strengthening;15 reps   Bar Weights/Barbell (Ulnar Deviation) 1 lb     Additional Wrist Exercises   Theraputty Flatten;Roll;Grip;Pinch;Locate Pegs   Theraputty - Flatten red- in standing   Theraputty - Roll red   Theraputty - Grip red   Theraputty - Pinch red   Theraputty - Locate Pegs red     Hand Exercises   MCPJ Flexion PROM;5 reps   MCPJ Extension PROM;5 reps   PIPJ Flexion PROM;5 reps   PIPJ Extension PROM;5 reps   DIPJ Flexion PROM;5 reps   DIPJ Extension PROM;5 reps   Other Hand Exercises velcro board-wrist flexion, extension, and forearm supination 5X each                OT Education - 01/11/17 1449    Education provided No          OT Short Term Goals - 01/11/17 1537      OT SHORT TERM  GOAL #1   Title Pt will be provided with and educated on HEP to improve mobility required for improved use of RUE as dominant.    Time 4   Period Weeks     OT SHORT TERM GOAL #2   Title Pt will decrease pain to 2/10 or less in RUE to improve ability to use RUE during B/IADL completion.    Time 4   Period Weeks     OT SHORT TERM GOAL #3   Title Pt will decrease RUE fascial restrictions to minimal amounts to improve mobility required for grooming tasks.    Time 4   Period Weeks     OT SHORT TERM GOAL #4   Title Pt will improve right wrist and finger A/ROM to WNL to improve ability to roll hair using RUE as dominant.    Time 4   Period Weeks   Status On-going     OT SHORT TERM GOAL #5   Title Pt will improve right wrist strength to 4+/5 to improve ability to complete meal preparation tasks.    Time 4   Period Weeks   Status  On-going     OT SHORT TERM GOAL #6   Title Pt will improve R grip strength by 20# and pinch strength by 5# to improve ability to grasp and hold household items.    Time 4   Period Weeks   Status On-going                  Plan - 01/11/17 1513    Clinical Impression Statement A: Completed P/ROM exercises for pt's wrist and hand. Pt reports she feels as though the ROM is significantly improving. Completed hand/wrist strengthening and increased weight for wrist extension stretch. Pt completed theraputty exercises focusing on wrist and digit extension. VC needed for form and technique.   Plan P: Complete strengthening excercises with 2#. Focus on wrist and digit extension.      Patient will benefit from skilled therapeutic intervention in order to improve the following deficits and impairments:  Impaired flexibility, Decreased strength, Decreased activity tolerance, Pain, Increased edema, Decreased range of motion, Increased fascial restricitons, Impaired UE functional use  Visit Diagnosis: Pain in right wrist  Stiffness of right wrist, not elsewhere classified  Other symptoms and signs involving the musculoskeletal system    Problem List There are no active problems to display for this patient.   Luther Hearing, OT Student [phone] 01/11/2017, 3:37 PM  [place] [address] [place], [place], [address] Phone: [phone]   Fax:  [phone]  Name: DONDA FRIEDLI MRN: [id number] Date of Birth: 1946-05-23     This qualified practitioner was present in the room guiding the student in service delivery. Therapy student was participating in the provision of services, and the practitioner was not engaged in treating another patient or doing other tasks at the same time.  Ailene Ravel, OTR/L,CBIS  [phone]

## 2017-01-13 ENCOUNTER — Ambulatory Visit (HOSPITAL_COMMUNITY): Payer: Medicare Other | Admitting: Occupational Therapy

## 2017-01-13 DIAGNOSIS — R29898 Other symptoms and signs involving the musculoskeletal system: Secondary | ICD-10-CM

## 2017-01-13 DIAGNOSIS — M25531 Pain in right wrist: Secondary | ICD-10-CM | POA: Diagnosis not present

## 2017-01-13 DIAGNOSIS — M25631 Stiffness of right wrist, not elsewhere classified: Secondary | ICD-10-CM

## 2017-01-14 ENCOUNTER — Encounter (HOSPITAL_COMMUNITY): Payer: Self-pay | Admitting: Occupational Therapy

## 2017-01-14 NOTE — Therapy (Signed)
[place], [place], [address] Phone: [phone]   Fax:  [phone]  Occupational Therapy Treatment  Patient Details  Name: Karen Nunez MRN: [id number] Date of Birth: 03-27-1946 Referring Provider: Dr. Elsie Saas  Encounter Date: 01/13/2017      OT End of Session - 01/14/17 1038    Visit Number 13   Number of Visits 18   Date for OT Re-Evaluation 01/23/17   Authorization Type [employer]   Authorization Time Period Before 19th visit   Authorization - Visit Number 13   Authorization - Number of Visits 19   OT Start Time 1519   OT Stop Time 1603   OT Time Calculation (min) 44 min   Activity Tolerance Patient tolerated treatment well   Behavior During Therapy [facility] for tasks assessed/performed      Past Medical History:  Diagnosis Date  . No pertinent past medical history     Past Surgical History:  Procedure Laterality Date  . ABDOMINAL HYSTERECTOMY    . Bilateral breast lumpectomies     all benign  . COLONOSCOPY  09/25/2011   Procedure: COLONOSCOPY;  Surgeon: Danie Binder, MD;  [facility];  Service: Endoscopy;  Laterality: N/A;  10:30 AM    There were no vitals filed for this visit.      Subjective Assessment - 01/13/17 1036    Subjective  S: I was able to tighten that glove again.    Currently in Pain? No/denies            [facility] OT Assessment - 01/14/17 1036      Assessment   Diagnosis Right distal radius fracture     Precautions   Precautions None                  OT Treatments/Exercises (OP) - 01/14/17 1036      Exercises   Exercises Wrist;Hand     Weighted Stretch Over Towel Roll   Wrist Flexion - Weighted Stretch 3 pounds;60 seconds   Wrist Extension - Weighted Stretch 3 pounds;60 seconds     Wrist Exercises   Forearm Supination PROM;5 reps;Strengthening;15 reps   Bar Weights/Barbell (Forearm Supination) 2 lbs   Forearm Pronation PROM;5  reps;Strengthening;15 reps   Bar Weights/Barbell (Forearm Pronation) 2 lbs   Wrist Flexion PROM;5 reps;Strengthening;15 reps   Bar Weights/Barbell (Wrist Flexion) 2 lbs   Wrist Extension PROM;5 reps;Strengthening;15 reps   Bar Weights/Barbell (Wrist Extension) 2 lbs   Wrist Radial Deviation PROM;5 reps;Strengthening;15 reps   Bar Weights/Barbell (Radial Deviation) 2 lbs   Wrist Ulnar Deviation PROM;5 reps;Strengthening;15 reps   Bar Weights/Barbell (Ulnar Deviation) 2 lbs     Additional Wrist Exercises   Theraputty Flatten   Theraputty - Flatten red- in standing   Hand Gripper with Large Beads all beads gripper at 29#   Hand Gripper with Medium Beads all beads gripper at 25#   Hand Gripper with Small Beads all beads gripper at 25#     Hand Exercises   MCPJ Flexion PROM;5 reps   MCPJ Extension PROM;5 reps   PIPJ Flexion PROM;5 reps   PIPJ Extension PROM;5 reps   DIPJ Flexion PROM;5 reps   DIPJ Extension PROM;5 reps     Manual Therapy   Manual Therapy Myofascial release   Manual therapy comments completed separately from therapeutic exercises   Myofascial Release Myofascial release to right hand, wrist, volar and dorsal forearm to decrease pain and fascial  restrictions and increase joint range of motion.                   OT Short Term Goals - 01/11/17 1537      OT SHORT TERM GOAL #1   Title Pt will be provided with and educated on HEP to improve mobility required for improved use of RUE as dominant.    Time 4   Period Weeks     OT SHORT TERM GOAL #2   Title Pt will decrease pain to 2/10 or less in RUE to improve ability to use RUE during B/IADL completion.    Time 4   Period Weeks     OT SHORT TERM GOAL #3   Title Pt will decrease RUE fascial restrictions to minimal amounts to improve mobility required for grooming tasks.    Time 4   Period Weeks     OT SHORT TERM GOAL #4   Title Pt will improve right wrist and finger A/ROM to WNL to improve ability to roll  hair using RUE as dominant.    Time 4   Period Weeks   Status On-going     OT SHORT TERM GOAL #5   Title Pt will improve right wrist strength to 4+/5 to improve ability to complete meal preparation tasks.    Time 4   Period Weeks   Status On-going     OT SHORT TERM GOAL #6   Title Pt will improve R grip strength by 20# and pinch strength by 5# to improve ability to grasp and hold household items.    Time 4   Period Weeks   Status On-going                  Plan - 01/13/17 1039    Clinical Impression Statement A: Pt reports she is continuing to use flexion glove, she is now able to make a flat fist with minimal difficulty. Continued with strengthening, increasing weight to 2#. Pt has been using 3# weight for weighted stretch at home, increased to 3# today. Increased gripper resistance for all bead sizes. Theraputty task focusing on wrist extension, verbal cuing for standing straight and keeping elbow in extension.    Plan P: continue with sustained passive stretching and wrist strengthening, add pinch task   OT Home Exercise Plan 6/22: finger and wrist A/ROM; 7/26: wrist strengthening   Consulted and Agree with Plan of Care Patient      Patient will benefit from skilled therapeutic intervention in order to improve the following deficits and impairments:  Impaired flexibility, Decreased strength, Decreased activity tolerance, Pain, Increased edema, Decreased range of motion, Increased fascial restricitons, Impaired UE functional use  Visit Diagnosis: Pain in right wrist  Stiffness of right wrist, not elsewhere classified  Other symptoms and signs involving the musculoskeletal system    Problem List There are no active problems to display for this patient.  [name], OTR/L  [phone] 01/14/2017, 10:42 AM  [place] [address] [place], [place], [address] Phone: [phone]   Fax:  [phone]  Name: Karen Nunez MRN: [id number] Date of Birth: 03-14-1946

## 2017-01-19 ENCOUNTER — Encounter (HOSPITAL_COMMUNITY): Payer: Self-pay

## 2017-01-19 ENCOUNTER — Ambulatory Visit (HOSPITAL_COMMUNITY): Payer: Medicare Other

## 2017-01-19 DIAGNOSIS — M25531 Pain in right wrist: Secondary | ICD-10-CM | POA: Diagnosis not present

## 2017-01-19 DIAGNOSIS — M25631 Stiffness of right wrist, not elsewhere classified: Secondary | ICD-10-CM

## 2017-01-19 DIAGNOSIS — R29898 Other symptoms and signs involving the musculoskeletal system: Secondary | ICD-10-CM

## 2017-01-19 NOTE — Therapy (Signed)
[place], [place], [address] Phone: [phone]   Fax:  [phone]  Occupational Therapy Treatment  Patient Details  Name: Karen Nunez MRN: [id number] Date of Birth: [birth year] Referring Provider: Dr. Elsie Saas  Encounter Date: 01/19/2017      OT End of Session - 01/19/17 1159    Visit Number 14   Number of Visits 18   Date for OT Re-Evaluation 01/23/17   Authorization Type [employer]   Authorization Time Period Before 19th visit   Authorization - Visit Number 88   Authorization - Number of Visits 19   OT Start Time 1120   OT Stop Time 1205   OT Time Calculation (min) 45 min   Activity Tolerance Patient tolerated treatment well   Behavior During Therapy [facility] for tasks assessed/performed      Past Medical History:  Diagnosis Date  . No pertinent past medical history     Past Surgical History:  Procedure Laterality Date  . ABDOMINAL HYSTERECTOMY    . Bilateral breast lumpectomies     all benign  . COLONOSCOPY  09/25/2011   Procedure: COLONOSCOPY;  Surgeon: Danie Binder, MD;  [facility];  Service: Endoscopy;  Laterality: N/A;  10:30 AM    There were no vitals filed for this visit.      Subjective Assessment - 01/19/17 1143    Subjective  S: I've been really working on trying to get it to bend and extend.    Currently in Pain? No/denies            [facility] OT Assessment - 01/19/17 1133      Assessment   Diagnosis Right distal radius fracture     Precautions   Precautions None                  OT Treatments/Exercises (OP) - 01/19/17 1133      Exercises   Exercises Wrist;Hand     Wrist Exercises   Forearm Supination Strengthening;15 reps   Bar Weights/Barbell (Forearm Supination) 3 lbs   Forearm Pronation Strengthening;15 reps   Bar Weights/Barbell (Forearm Pronation) 3 lbs   Wrist Flexion PROM;5 reps;Strengthening;15 reps   Bar Weights/Barbell  (Wrist Flexion) 3 lbs   Wrist Extension PROM;5 reps;Strengthening;15 reps   Bar Weights/Barbell (Wrist Extension) 3 lbs   Wrist Radial Deviation Strengthening;15 reps   Bar Weights/Barbell (Radial Deviation) 3 lbs   Wrist Ulnar Deviation Strengthening;15 reps   Bar Weights/Barbell (Ulnar Deviation) 3 lbs   Other wrist exercises Wrist extension stretch on table top; 3 sets; 10 seconds each     Additional Wrist Exercises   Theraputty Flatten   Theraputty - Flatten red- in standing     Hand Exercises   Other Hand Exercises Pt used pvc pipe to push circles into flattened red putty focusing on flexion and extension of wrist.    Other Hand Exercises Patient utilized green resistive clothespin with a 3 point pinch to pick up 30 sponges with mod difficulty     Manual Therapy   Manual Therapy Myofascial release;Muscle Energy Technique   Manual therapy comments completed separately from therapeutic exercises   Myofascial Release Myofascial release to right hand, wrist, volar and dorsal forearm to decrease pain and fascial restrictions and increase joint range of motion.    Muscle Energy Technique Muscle energy technique completed to right wrist flexors and extensors to relax tone and muscle spasm and improve range of motion.  OT Short Term Goals - 01/11/17 1537      OT SHORT TERM GOAL #1   Title Pt will be provided with and educated on HEP to improve mobility required for improved use of RUE as dominant.    Time 4   Period Weeks     OT SHORT TERM GOAL #2   Title Pt will decrease pain to 2/10 or less in RUE to improve ability to use RUE during B/IADL completion.    Time 4   Period Weeks     OT SHORT TERM GOAL #3   Title Pt will decrease RUE fascial restrictions to minimal amounts to improve mobility required for grooming tasks.    Time 4   Period Weeks     OT SHORT TERM GOAL #4   Title Pt will improve right wrist and finger A/ROM to WNL to improve ability  to roll hair using RUE as dominant.    Time 4   Period Weeks   Status On-going     OT SHORT TERM GOAL #5   Title Pt will improve right wrist strength to 4+/5 to improve ability to complete meal preparation tasks.    Time 4   Period Weeks   Status On-going     OT SHORT TERM GOAL #6   Title Pt will improve R grip strength by 20# and pinch strength by 5# to improve ability to grasp and hold household items.    Time 4   Period Weeks   Status On-going                  Plan - 01/19/17 1200    Clinical Impression Statement A: Pt was shown how to complete wrist extension stretch on table top and encouraged to complete at home with HEP. Upgraded to green resistive clothespins for pinch task which was mod difficult for patient.    Plan P: Continue with muscle energy technique to increase passive wrist flexion and extension.       Patient will benefit from skilled therapeutic intervention in order to improve the following deficits and impairments:  Impaired flexibility, Decreased strength, Decreased activity tolerance, Pain, Increased edema, Decreased range of motion, Increased fascial restricitons, Impaired UE functional use  Visit Diagnosis: Pain in right wrist  Stiffness of right wrist, not elsewhere classified  Other symptoms and signs involving the musculoskeletal system    Problem List There are no active problems to display for this patient.  Ailene Ravel, OTR/L,CBIS  [phone]  01/19/2017, 12:15 PM  [place] [address] [place], [place], [address] Phone: [phone]   Fax:  [phone]  Name: Karen Nunez MRN: [id number] Date of Birth: [birth year]

## 2017-01-21 ENCOUNTER — Ambulatory Visit (HOSPITAL_COMMUNITY): Payer: Medicare Other | Admitting: Occupational Therapy

## 2017-01-21 ENCOUNTER — Encounter (HOSPITAL_COMMUNITY): Payer: Self-pay | Admitting: Occupational Therapy

## 2017-01-21 DIAGNOSIS — R29898 Other symptoms and signs involving the musculoskeletal system: Secondary | ICD-10-CM

## 2017-01-21 DIAGNOSIS — M25631 Stiffness of right wrist, not elsewhere classified: Secondary | ICD-10-CM

## 2017-01-21 DIAGNOSIS — M25531 Pain in right wrist: Secondary | ICD-10-CM

## 2017-01-21 NOTE — Therapy (Signed)
[place], [place], [address] Phone: [phone]   Fax:  [phone]  Occupational Therapy Reassessment, Treatment (and recertification)  Patient Details  Name: Karen Nunez MRN: [id number] Date of Birth: 1945/07/16 Referring Provider: Dr. Elsie Saas  Encounter Date: 01/21/2017      OT End of Session - 01/21/17 1611    Visit Number 15   Number of Visits 21   Date for OT Re-Evaluation 02/12/17   Authorization Type [employer]   Authorization Time Period Before 19th visit   Authorization - Visit Number 15   Authorization - Number of Visits 19   OT Start Time 1425   OT Stop Time 1512   OT Time Calculation (min) 47 min   Activity Tolerance Patient tolerated treatment well   Behavior During Therapy [facility] for tasks assessed/performed      Past Medical History:  Diagnosis Date  . No pertinent past medical history     Past Surgical History:  Procedure Laterality Date  . ABDOMINAL HYSTERECTOMY    . Bilateral breast lumpectomies     all benign  . COLONOSCOPY  09/25/2011   Procedure: COLONOSCOPY;  Surgeon: Danie Binder, MD;  [facility];  Service: Endoscopy;  Laterality: N/A;  10:30 AM    There were no vitals filed for this visit.      Subjective Assessment - 01/21/17 1421    Subjective  S: I've been using those 3 pound weights.             [facility] OT Assessment - 01/21/17 1421      Assessment   Diagnosis Right distal radius fracture     Precautions   Precautions None     Palpation   Palpation comment Trace fascial restrictions along thenar eminence, wrist, dorsal and volar forearm     AROM   AROM Assessment Site Forearm;Wrist   Right/Left Forearm Right   Right Forearm Pronation 90 Degrees  same as previous   Right Forearm Supination 90 Degrees  75 previous   Right/Left Wrist Right   Right Wrist Extension 50 Degrees  30 previous   Right Wrist Flexion 52 Degrees  42  previous   Right Wrist Radial Deviation 20 Degrees  WNL; 18 previous   Right Wrist Ulnar Deviation 22 Degrees  WNL; same as previous     PROM   PROM Assessment Site Forearm;Wrist   Right/Left Forearm Right   Right/Left Wrist Right   Right Wrist Extension 60 Degrees  52 previous   Right Wrist Flexion 65 Degrees  50 previous   Right Wrist Radial Deviation 25 Degrees  WNL; same as previous   Right Wrist Ulnar Deviation 25 Degrees  WNL; same as previous     Strength   Strength Assessment Site Forearm;Wrist   Right/Left Forearm Right   Right Forearm Pronation 4/5  3+/5 previous   Right Forearm Supination 4/5  3/5 previous   Right/Left Wrist Right   Right Wrist Flexion 4+/5  4-/5 previous   Right Wrist Extension 4+/5  4-/5 previous   Right Wrist Radial Deviation 5/5  4-/5 previous   Right Wrist Ulnar Deviation 5/5  4-/5 previous     Right Hand AROM   R Index  MCP 0-90 62 Degrees  58 previous   R Index PIP 0-100 80 Degrees  74 previous   R Index DIP 0-70 58 Degrees  43 previous   R Long  MCP 0-90 60 Degrees  same as previous   R Long PIP 0-100 80 Degrees  74 previous   R Long DIP 0-70 70 Degrees  WNL; same as previous   R Ring  MCP 0-90 62 Degrees  52 previous   R Ring PIP 0-100 84 Degrees  same as previous   R Ring DIP 0-70 70 Degrees  WNL; same as previous   R Little  MCP 0-90 52 Degrees  50 previous   R Little PIP 0-100 80 Degrees  78 previous   R Little DIP 0-70 76 Degrees  WNL; same as previous     Hand Function   Right Hand Gross Grasp Functional   Right Hand Grip (lbs) 22  15 previous   Right Hand Lateral Pinch 6 lbs  5 previous   Right Hand 3 Point Pinch 6 lbs  3 previous                  OT Treatments/Exercises (OP) - 01/21/17 1453      Exercises   Exercises Wrist;Hand     Wrist Exercises   Other wrist exercises Wrist extension stretch on table top; 3 sets; 10 seconds each     Additional Wrist Exercises   Hand Gripper with  Large Beads all beads gripper at 38#   Hand Gripper with Medium Beads all beads gripper at 38#   Hand Gripper with Small Beads all beads gripper at 35#     Hand Exercises   MCPJ Flexion PROM;5 reps   MCPJ Extension PROM;5 reps   PIPJ Flexion PROM;5 reps   PIPJ Extension PROM;5 reps   DIPJ Flexion PROM;5 reps   DIPJ Extension PROM;5 reps     Manual Therapy   Manual Therapy Myofascial release;Muscle Energy Technique   Manual therapy comments completed separately from therapeutic exercises   Myofascial Release Myofascial release to right hand, wrist, volar and dorsal forearm to decrease pain and fascial restrictions and increase joint range of motion.    Muscle Energy Technique Muscle energy technique completed to right wrist flexors and extensors to relax tone and muscle spasm and improve range of motion.                    OT Short Term Goals - 01/21/17 1612      OT SHORT TERM GOAL #1   Title Pt will be provided with and educated on HEP to improve mobility required for improved use of RUE as dominant.    Time 4   Period Weeks     OT SHORT TERM GOAL #2   Title Pt will decrease pain to 2/10 or less in RUE to improve ability to use RUE during B/IADL completion.    Time 4   Period Weeks   Status Achieved     OT SHORT TERM GOAL #3   Title Pt will decrease RUE fascial restrictions to minimal amounts to improve mobility required for grooming tasks.    Time 4   Period Weeks     OT SHORT TERM GOAL #4   Title Pt will improve right wrist and finger A/ROM to WNL to improve ability to roll hair using RUE as dominant.    Time 4   Period Weeks   Status Partially Met     OT SHORT TERM GOAL #5   Title Pt will improve right wrist strength to 4+/5 to improve ability to complete meal preparation tasks.    Time 4   Period Weeks   Status Partially  Met     OT SHORT TERM GOAL #6   Title Pt will improve R grip strength by 20# and pinch strength by 5# to improve ability to grasp and  hold household items.    Time 4   Period Weeks   Status On-going                  Plan - 01/21/17 1612    Clinical Impression Statement A: Reassessment completed this session, pt has met 3/6 goals and has partially 2 additional goals. Pt has achieved ROM WFL and is making progress towards grip and pinch strength goals. Pt continues to have limitations with wrist flexion and extension, wrist strength, grip and pinch strength. Pt is agreeable to complete 3 additional weeks of therapy focusing on these areas. Pt is still wearing flexion glove and MCP flexion is improving.    Rehab Potential Good   OT Frequency 2x / week   OT Duration --  3 weeks   OT Treatment/Interventions Self-care/ADL training;Therapeutic exercise;Patient/family education;Ultrasound;Splinting;Manual Therapy;Therapeutic activities;DME and/or AE instruction;Cryotherapy;Electrical Stimulation;Moist Heat;Passive range of motion   Plan P: continue with muscle energy technique for wrist flexion and extension, as well as improving wrist strength, grip and pinch strength, and functional use of right hand/wrist during B/IADL tasks.    OT Home Exercise Plan 6/22: finger and wrist A/ROM; 7/26: wrist strengthening   Consulted and Agree with Plan of Care Patient      Patient will benefit from skilled therapeutic intervention in order to improve the following deficits and impairments:  Decreased mobility  Visit Diagnosis: Pain in right wrist  Stiffness of right wrist, not elsewhere classified  Other symptoms and signs involving the musculoskeletal system    Problem List There are no active problems to display for this patient.  [name], OTR/L  [phone] 01/21/2017, 4:16 PM  [facility] [address] [place], [place], [address] Phone: [phone]   Fax:  [phone]  Name: ABBYE LAO MRN: [id number] Date of Birth: 02/27/1946

## 2017-01-27 ENCOUNTER — Ambulatory Visit (HOSPITAL_COMMUNITY): Payer: Medicare Other | Admitting: Occupational Therapy

## 2017-01-27 ENCOUNTER — Encounter (HOSPITAL_COMMUNITY): Payer: Self-pay | Admitting: Occupational Therapy

## 2017-01-27 DIAGNOSIS — R29898 Other symptoms and signs involving the musculoskeletal system: Secondary | ICD-10-CM

## 2017-01-27 DIAGNOSIS — M25631 Stiffness of right wrist, not elsewhere classified: Secondary | ICD-10-CM

## 2017-01-27 DIAGNOSIS — M25531 Pain in right wrist: Secondary | ICD-10-CM | POA: Diagnosis not present

## 2017-01-27 NOTE — Therapy (Signed)
[place], [place], [address] Phone: [phone]   Fax:  [phone]  Occupational Therapy Treatment  Patient Details  Name: Karen Nunez MRN: [id number] Date of Birth: [birth year] Referring Provider: Dr. Elsie Saas  Encounter Date: 01/27/2017      OT End of Session - 01/27/17 1433    Visit Number 16   Number of Visits 21   Date for OT Re-Evaluation 02/12/17   Authorization Type [employer]   Authorization Time Period Before 19th visit   Authorization - Visit Number 64   Authorization - Number of Visits 19   OT Start Time 1348   OT Stop Time 1433   OT Time Calculation (min) 45 min   Activity Tolerance Patient tolerated treatment well   Behavior During Therapy [facility] for tasks assessed/performed      Past Medical History:  Diagnosis Date  . No pertinent past medical history     Past Surgical History:  Procedure Laterality Date  . ABDOMINAL HYSTERECTOMY    . Bilateral breast lumpectomies     all benign  . COLONOSCOPY  09/25/2011   Procedure: COLONOSCOPY;  Surgeon: Danie Binder, MD;  [facility];  Service: Endoscopy;  Laterality: N/A;  10:30 AM    There were no vitals filed for this visit.      Subjective Assessment - 01/27/17 1345    Subjective  S: The doctor said I'm looking good.    Currently in Pain? No/denies            [facility] OT Assessment - 01/27/17 1345      Assessment   Diagnosis Right distal radius fracture     Precautions   Precautions None                  OT Treatments/Exercises (OP) - 01/27/17 1402      Exercises   Exercises Wrist;Hand     Weighted Stretch Over Towel Roll   Wrist Extension - Weighted Stretch 3 pounds;60 seconds     Wrist Exercises   Forearm Supination Strengthening;15 reps   Bar Weights/Barbell (Forearm Supination) 3 lbs   Forearm Pronation Strengthening;15 reps   Bar Weights/Barbell (Forearm Pronation) 3 lbs   Wrist  Flexion PROM;5 reps;Strengthening;15 reps   Bar Weights/Barbell (Wrist Flexion) 3 lbs   Wrist Extension PROM;5 reps;Strengthening;15 reps   Bar Weights/Barbell (Wrist Extension) 3 lbs   Wrist Radial Deviation Strengthening;15 reps   Bar Weights/Barbell (Radial Deviation) 3 lbs   Wrist Ulnar Deviation Strengthening;15 reps   Bar Weights/Barbell (Ulnar Deviation) 3 lbs   Other wrist exercises Wrist extension stretch at wall; 3 sets; 10 seconds each     Additional Wrist Exercises   Hand Gripper with Large Beads all beads gripper at 38#   Hand Gripper with Medium Beads all beads gripper at 38#   Hand Gripper with Small Beads all beads gripper at 35#     Hand Exercises   Other Hand Exercises Patient utilized green resistive clothespin with a lateral pinch to pick up 30 sponges with min/mod difficulty. Pt the used green clothespin with 3 point pinch to grasp and place 25 sponges in bucket with mod difficulty     Manual Therapy   Manual Therapy Myofascial release   Manual therapy comments completed separately from therapeutic exercises   Myofascial Release Myofascial release to right hand, wrist, volar and dorsal forearm to decrease pain and fascial restrictions and increase joint range of motion.  OT Short Term Goals - 01/21/17 1612      OT SHORT TERM GOAL #1   Title Pt will be provided with and educated on HEP to improve mobility required for improved use of RUE as dominant.    Time 4   Period Weeks     OT SHORT TERM GOAL #2   Title Pt will decrease pain to 2/10 or less in RUE to improve ability to use RUE during B/IADL completion.    Time 4   Period Weeks   Status Achieved     OT SHORT TERM GOAL #3   Title Pt will decrease RUE fascial restrictions to minimal amounts to improve mobility required for grooming tasks.    Time 4   Period Weeks     OT SHORT TERM GOAL #4   Title Pt will improve right wrist and finger A/ROM to WNL to improve ability to roll  hair using RUE as dominant.    Time 4   Period Weeks   Status Partially Met     OT SHORT TERM GOAL #5   Title Pt will improve right wrist strength to 4+/5 to improve ability to complete meal preparation tasks.    Time 4   Period Weeks   Status Partially Met     OT SHORT TERM GOAL #6   Title Pt will improve R grip strength by 20# and pinch strength by 5# to improve ability to grasp and hold household items.    Time 4   Period Weeks   Status On-going                  Plan - 01/27/17 1433    Clinical Impression Statement A: Pt reports MD is pleased with her progress, also reporting she has been completing her HEP daily and is able to see improvements. Continued with strengthening using 3# weights, educated on wrist extension stretch at wall as pt has difficulty with table height at home. Also continued grip and pinch strengthening, verbal cuing for taking rest breaks.    Plan P: continue working on improved wrist flexion and extension, grip and pinch strengthening. Continue muscle energy technique for wrist flexion/extension   OT Home Exercise Plan 6/22: finger and wrist A/ROM; 7/26: wrist strengthening   Consulted and Agree with Plan of Care Patient      Patient will benefit from skilled therapeutic intervention in order to improve the following deficits and impairments:  Decreased strength, Decreased activity tolerance, Decreased range of motion, Increased fascial restricitons, Impaired UE functional use  Visit Diagnosis: Pain in right wrist  Stiffness of right wrist, not elsewhere classified  Other symptoms and signs involving the musculoskeletal system    Problem List There are no active problems to display for this patient.  [name], OTR/L  [phone] 01/27/2017, 2:36 PM  [facility] [address] [place], [place], [address] Phone: [phone]   Fax:  [phone]  Name: MARCHEL FOOTE MRN:  [id number] Date of Birth: [birth year]

## 2017-01-29 ENCOUNTER — Ambulatory Visit (HOSPITAL_COMMUNITY): Payer: Medicare Other | Admitting: Occupational Therapy

## 2017-01-29 ENCOUNTER — Encounter (HOSPITAL_COMMUNITY): Payer: Self-pay | Admitting: Occupational Therapy

## 2017-01-29 DIAGNOSIS — R29898 Other symptoms and signs involving the musculoskeletal system: Secondary | ICD-10-CM

## 2017-01-29 DIAGNOSIS — M25531 Pain in right wrist: Secondary | ICD-10-CM | POA: Diagnosis not present

## 2017-01-29 DIAGNOSIS — M25631 Stiffness of right wrist, not elsewhere classified: Secondary | ICD-10-CM

## 2017-01-29 NOTE — Therapy (Signed)
[place], [place], [address] Phone: [phone]   Fax:  [phone]  Occupational Therapy Treatment  Patient Details  Name: Karen Nunez MRN: [id number] Date of Birth: 04-28-1946 Referring Provider: Dr. Elsie Saas  Encounter Date: 01/29/2017      OT End of Session - 01/29/17 1224    Visit Number 17   Number of Visits 21   Date for OT Re-Evaluation 02/12/17   Authorization Type [employer]   Authorization Time Period Before 19th visit   Authorization - Visit Number 86   Authorization - Number of Visits 19   OT Start Time 1033   OT Stop Time 1115   OT Time Calculation (min) 42 min   Activity Tolerance Patient tolerated treatment well   Behavior During Therapy [facility] for tasks assessed/performed      Past Medical History:  Diagnosis Date  . No pertinent past medical history     Past Surgical History:  Procedure Laterality Date  . ABDOMINAL HYSTERECTOMY    . Bilateral breast lumpectomies     all benign  . COLONOSCOPY  09/25/2011   Procedure: COLONOSCOPY;  Surgeon: Danie Binder, MD;  [facility];  Service: Endoscopy;  Laterality: N/A;  10:30 AM    There were no vitals filed for this visit.      Subjective Assessment - 01/29/17 1034    Subjective  S: I hold those weights for 5 minutes at home.    Currently in Pain? No/denies            [facility] OT Assessment - 01/29/17 1049      Assessment   Diagnosis Right distal radius fracture     Precautions   Precautions None                  OT Treatments/Exercises (OP) - 01/29/17 1034      Exercises   Exercises Wrist;Hand     Weighted Stretch Over Towel Roll   Wrist Extension - Weighted Stretch 30 seconds;2 minutes     Wrist Exercises   Forearm Supination Strengthening;15 reps   Bar Weights/Barbell (Forearm Supination) 3 lbs   Forearm Pronation Strengthening;15 reps   Bar Weights/Barbell (Forearm Pronation) 3  lbs   Wrist Flexion PROM;5 reps;Strengthening;15 reps   Bar Weights/Barbell (Wrist Flexion) 3 lbs   Wrist Extension PROM;5 reps;Strengthening;15 reps   Bar Weights/Barbell (Wrist Extension) 3 lbs   Wrist Radial Deviation Strengthening;15 reps   Bar Weights/Barbell (Radial Deviation) 3 lbs   Wrist Ulnar Deviation Strengthening;15 reps   Bar Weights/Barbell (Ulnar Deviation) 3 lbs     Additional Wrist Exercises   Theraputty Flatten   Theraputty - Flatten red in standing   Hand Gripper with Large Beads all beads gripper at 42#   Hand Gripper with Medium Beads all beads gripper at 38#   Hand Gripper with Small Beads all beads gripper at 35#     Manual Therapy   Manual Therapy Myofascial release   Manual therapy comments completed separately from therapeutic exercises   Myofascial Release Myofascial release to right hand, wrist, volar and dorsal forearm to decrease pain and fascial restrictions and increase joint range of motion.                   OT Short Term Goals - 01/21/17 1612      OT SHORT TERM GOAL #1   Title Pt will be provided with and educated on HEP to  improve mobility required for improved use of RUE as dominant.    Time 4   Period Weeks     OT SHORT TERM GOAL #2   Title Pt will decrease pain to 2/10 or less in RUE to improve ability to use RUE during B/IADL completion.    Time 4   Period Weeks   Status Achieved     OT SHORT TERM GOAL #3   Title Pt will decrease RUE fascial restrictions to minimal amounts to improve mobility required for grooming tasks.    Time 4   Period Weeks     OT SHORT TERM GOAL #4   Title Pt will improve right wrist and finger A/ROM to WNL to improve ability to roll hair using RUE as dominant.    Time 4   Period Weeks   Status Partially Met     OT SHORT TERM GOAL #5   Title Pt will improve right wrist strength to 4+/5 to improve ability to complete meal preparation tasks.    Time 4   Period Weeks   Status Partially Met      OT SHORT TERM GOAL #6   Title Pt will improve R grip strength by 20# and pinch strength by 5# to improve ability to grasp and hold household items.    Time 4   Period Weeks   Status On-going                  Plan - 01/29/17 1225    Clinical Impression Statement A: Continued with wrist and grip strengthening this session, pt with difficulty during small bead task compared to previous sessions. Pt able to tolerate increased passive stretching of wrist extension to WNL. Educated on not over-exercising at home to limit soreness/fatigue   Plan P: Continue muscle energy technqiue for improved wrist flexion/extension   OT Home Exercise Plan 6/22: finger and wrist A/ROM; 7/26: wrist strengthening   Consulted and Agree with Plan of Care Patient      Patient will benefit from skilled therapeutic intervention in order to improve the following deficits and impairments:  Decreased strength, Decreased activity tolerance, Decreased range of motion, Increased fascial restricitons, Impaired UE functional use  Visit Diagnosis: Pain in right wrist  Stiffness of right wrist, not elsewhere classified  Other symptoms and signs involving the musculoskeletal system    Problem List There are no active problems to display for this patient.  [name], OTR/L  [phone] 01/29/2017, 12:26 PM  [facility] [address] [place], [place], [address] Phone: [phone]   Fax:  [phone]  Name: Karen Nunez MRN: [id number] Date of Birth: September 02, 1945

## 2017-02-03 ENCOUNTER — Ambulatory Visit (HOSPITAL_COMMUNITY): Payer: Medicare Other | Admitting: Occupational Therapy

## 2017-02-03 ENCOUNTER — Encounter (HOSPITAL_COMMUNITY): Payer: Self-pay | Admitting: Occupational Therapy

## 2017-02-03 DIAGNOSIS — M25531 Pain in right wrist: Secondary | ICD-10-CM | POA: Diagnosis not present

## 2017-02-03 DIAGNOSIS — M25631 Stiffness of right wrist, not elsewhere classified: Secondary | ICD-10-CM

## 2017-02-03 DIAGNOSIS — R29898 Other symptoms and signs involving the musculoskeletal system: Secondary | ICD-10-CM

## 2017-02-03 NOTE — Therapy (Signed)
[place], [place], [address] Phone: [phone]   Fax:  [phone]  Occupational Therapy Treatment  Patient Details  Name: Karen Nunez MRN: [id number] Date of Birth: [birth year] Referring Provider: Dr. Elsie Saas  Encounter Date: 02/03/2017      OT End of Session - 02/03/17 1513    Visit Number 18   Number of Visits 21   Date for OT Re-Evaluation 02/12/17   Authorization Type [employer]   Authorization Time Period Before 19th visit   Authorization - Visit Number 18   Authorization - Number of Visits 19   OT Start Time 1431   OT Stop Time 1515   OT Time Calculation (min) 44 min   Activity Tolerance Patient tolerated treatment well   Behavior During Therapy [facility] for tasks assessed/performed      Past Medical History:  Diagnosis Date  . No pertinent past medical history     Past Surgical History:  Procedure Laterality Date  . ABDOMINAL HYSTERECTOMY    . Bilateral breast lumpectomies     all benign  . COLONOSCOPY  09/25/2011   Procedure: COLONOSCOPY;  Surgeon: Danie Binder, MD;  [facility];  Service: Endoscopy;  Laterality: N/A;  10:30 AM    There were no vitals filed for this visit.      Subjective Assessment - 02/03/17 1431    Subjective  S: I get sore on sometimes but not much anymore.    Currently in Pain? No/denies            [facility] OT Assessment - 02/03/17 1431      Assessment   Diagnosis Right distal radius fracture     Precautions   Precautions None                  OT Treatments/Exercises (OP) - 02/03/17 1433      Exercises   Exercises Wrist;Hand     Wrist Exercises   Forearm Supination Strengthening;15 reps   Bar Weights/Barbell (Forearm Supination) 4 lbs   Forearm Pronation Strengthening;15 reps   Bar Weights/Barbell (Forearm Pronation) 4 lbs   Wrist Flexion PROM;5 reps;Strengthening;15 reps   Bar Weights/Barbell (Wrist Flexion) 4  lbs   Wrist Extension PROM;5 reps;Strengthening;15 reps   Bar Weights/Barbell (Wrist Extension) 4 lbs   Wrist Radial Deviation Strengthening;15 reps   Bar Weights/Barbell (Radial Deviation) 4 lbs   Wrist Ulnar Deviation Strengthening;15 reps   Bar Weights/Barbell (Ulnar Deviation) 4 lbs     Additional Wrist Exercises   Theraputty Flatten   Theraputty - Flatten red in standing   Hand Gripper with Large Beads all beads gripper at 42#   Hand Gripper with Medium Beads all beads gripper at 42#   Hand Gripper with Small Beads all beads gripper at 35#     Hand Exercises   Other Hand Exercises Pt used pvc pipe to push circles into flattened red putty focusing on flexion and extension of wrist.      Manual Therapy   Manual Therapy Myofascial release   Manual therapy comments completed separately from therapeutic exercises   Myofascial Release Myofascial release to right hand, wrist, volar and dorsal forearm to decrease pain and fascial restrictions and increase joint range of motion.                   OT Short Term Goals - 01/21/17 1612      OT SHORT TERM GOAL #1  Title Pt will be provided with and educated on HEP to improve mobility required for improved use of RUE as dominant.    Time 4   Period Weeks     OT SHORT TERM GOAL #2   Title Pt will decrease pain to 2/10 or less in RUE to improve ability to use RUE during B/IADL completion.    Time 4   Period Weeks   Status Achieved     OT SHORT TERM GOAL #3   Title Pt will decrease RUE fascial restrictions to minimal amounts to improve mobility required for grooming tasks.    Time 4   Period Weeks     OT SHORT TERM GOAL #4   Title Pt will improve right wrist and finger A/ROM to WNL to improve ability to roll hair using RUE as dominant.    Time 4   Period Weeks   Status Partially Met     OT SHORT TERM GOAL #5   Title Pt will improve right wrist strength to 4+/5 to improve ability to complete meal preparation tasks.     Time 4   Period Weeks   Status Partially Met     OT SHORT TERM GOAL #6   Title Pt will improve R grip strength by 20# and pinch strength by 5# to improve ability to grasp and hold household items.    Time 4   Period Weeks   Status On-going                  Plan - 02/03/17 1511    Clinical Impression Statement A: Continued with wrist ROM and strengthening, increasing to 4# this session. Pt able to increase gripper to 42# for medium beads, also able to complete small beads in less time. Completed pvc pipe activity with improvements noted in wrist ROM during task.    Plan P: update G-code; continue working to improve strength , resume pinch strengthening.    OT Home Exercise Plan 6/22: finger and wrist A/ROM; 7/26: wrist strengthening   Consulted and Agree with Plan of Care Patient      Patient will benefit from skilled therapeutic intervention in order to improve the following deficits and impairments:  Decreased strength, Decreased activity tolerance, Decreased range of motion, Increased fascial restricitons, Impaired UE functional use  Visit Diagnosis: Pain in right wrist  Stiffness of right wrist, not elsewhere classified  Other symptoms and signs involving the musculoskeletal system    Problem List There are no active problems to display for this patient.  [name], OTR/L  [phone] 02/03/2017, 3:16 PM  [facility] [address] [place], [place], [address] Phone: [phone]   Fax:  [phone]  Name: Karen Nunez MRN: [id number] Date of Birth: 1945-11-04

## 2017-02-05 ENCOUNTER — Ambulatory Visit (HOSPITAL_COMMUNITY): Payer: Medicare Other | Admitting: Occupational Therapy

## 2017-02-05 ENCOUNTER — Encounter (HOSPITAL_COMMUNITY): Payer: Self-pay | Admitting: Occupational Therapy

## 2017-02-05 DIAGNOSIS — R29898 Other symptoms and signs involving the musculoskeletal system: Secondary | ICD-10-CM

## 2017-02-05 DIAGNOSIS — M25631 Stiffness of right wrist, not elsewhere classified: Secondary | ICD-10-CM

## 2017-02-05 DIAGNOSIS — M25531 Pain in right wrist: Secondary | ICD-10-CM

## 2017-02-05 NOTE — Therapy (Signed)
[place], [place], [address] Phone: [phone]   Fax:  [phone]  Occupational Therapy Treatment  Patient Details  Name: Karen Nunez MRN: [id number] Date of Birth: 11-20-1945 Referring Provider: Dr. Elsie Saas  Encounter Date: 02/05/2017      OT End of Session - 02/05/17 1424    Visit Number 19   Number of Visits 21   Date for OT Re-Evaluation 02/12/17   Authorization Type [employer]   Authorization Time Period Before 29th visit   Authorization - Visit Number 19   Authorization - Number of Visits 29   OT Start Time 1301   OT Stop Time 1343   OT Time Calculation (min) 42 min   Activity Tolerance Patient tolerated treatment well   Behavior During Therapy [facility] for tasks assessed/performed      Past Medical History:  Diagnosis Date  . No pertinent past medical history     Past Surgical History:  Procedure Laterality Date  . ABDOMINAL HYSTERECTOMY    . Bilateral breast lumpectomies     all benign  . COLONOSCOPY  09/25/2011   Procedure: COLONOSCOPY;  Surgeon: Danie Binder, MD;  [facility];  Service: Endoscopy;  Laterality: N/A;  10:30 AM    There were no vitals filed for this visit.      Subjective Assessment - 02/05/17 1301    Subjective  S: It still gets stiff when I haven't done a lot with it.    Currently in Pain? No/denies            [facility] OT Assessment - 02/05/17 1301      Assessment   Diagnosis Right distal radius fracture     Precautions   Precautions None                  OT Treatments/Exercises (OP) - 02/05/17 1302      Exercises   Exercises Wrist;Hand     Wrist Exercises   Forearm Supination Strengthening;15 reps   Bar Weights/Barbell (Forearm Supination) 4 lbs   Forearm Pronation Strengthening;15 reps   Bar Weights/Barbell (Forearm Pronation) 4 lbs   Wrist Flexion PROM;5 reps;Strengthening;15 reps   Bar Weights/Barbell (Wrist  Flexion) 4 lbs   Wrist Extension PROM;5 reps;Strengthening;15 reps   Bar Weights/Barbell (Wrist Extension) 4 lbs   Wrist Radial Deviation Strengthening;15 reps   Bar Weights/Barbell (Radial Deviation) 4 lbs   Wrist Ulnar Deviation Strengthening;15 reps   Bar Weights/Barbell (Ulnar Deviation) 4 lbs     Additional Wrist Exercises   Theraputty Roll;Grip;Pinch   Theraputty - Roll red   Theraputty - Grip red   Theraputty - Pinch --   Hand Gripper with Large Beads all beads gripper at 42#   Hand Gripper with Medium Beads all beads gripper at 42#   Hand Gripper with Small Beads all beads gripper at 35#     Hand Exercises   Other Hand Exercises Patient utilized green resistive clothespin with a lateral pinch to pick up 30 sponges with min/mod difficulty; unable to complete with blue clothespin     Manual Therapy   Manual Therapy Myofascial release   Manual therapy comments completed separately from therapeutic exercises   Myofascial Release Myofascial release to right hand, wrist, volar and dorsal forearm to decrease pain and fascial restrictions and increase joint range of motion.                   OT Short  Term Goals - 01/21/17 1612      OT SHORT TERM GOAL #1   Title Pt will be provided with and educated on HEP to improve mobility required for improved use of RUE as dominant.    Time 4   Period Weeks     OT SHORT TERM GOAL #2   Title Pt will decrease pain to 2/10 or less in RUE to improve ability to use RUE during B/IADL completion.    Time 4   Period Weeks   Status Achieved     OT SHORT TERM GOAL #3   Title Pt will decrease RUE fascial restrictions to minimal amounts to improve mobility required for grooming tasks.    Time 4   Period Weeks     OT SHORT TERM GOAL #4   Title Pt will improve right wrist and finger A/ROM to WNL to improve ability to roll hair using RUE as dominant.    Time 4   Period Weeks   Status Partially Met     OT SHORT TERM GOAL #5   Title  Pt will improve right wrist strength to 4+/5 to improve ability to complete meal preparation tasks.    Time 4   Period Weeks   Status Partially Met     OT SHORT TERM GOAL #6   Title Pt will improve R grip strength by 20# and pinch strength by 5# to improve ability to grasp and hold household items.    Time 4   Period Weeks   Status On-going                  Plan - 2017/02/17 1424    Clinical Impression Statement A: Continued with wrist ROM, pt able to tolerate P/ROM WNL easily this session, continued with 4# weight for wrist strengthening. Pt able to complete grip strengthening in decreased time today, resumed pinch strengthening however pt unable to complete with blue clothespin.    OT Treatment/Interventions Self-care/ADL training;Therapeutic exercise;Patient/family education;Ultrasound;Splinting;Manual Therapy;Therapeutic activities;DME and/or AE instruction;Cryotherapy;Electrical Stimulation;Moist Heat;Passive range of motion   Plan P: Continue with grip and pinch strengthening   OT Home Exercise Plan 6/22: finger and wrist A/ROM; 7/26: wrist strengthening   Consulted and Agree with Plan of Care Patient      Patient will benefit from skilled therapeutic intervention in order to improve the following deficits and impairments:     Visit Diagnosis: Pain in right wrist  Stiffness of right wrist, not elsewhere classified  Other symptoms and signs involving the musculoskeletal system      G-Codes - 02-17-17 1425    Functional Assessment Tool Used (Outpatient only) clinical judgement   Functional Limitation Carrying, moving and handling objects   Carrying, Moving and Handling Objects Current Status (P5945) At least 20 percent but less than 40 percent impaired, limited or restricted   Carrying, Moving and Handling Objects Goal Status (O5929) At least 1 percent but less than 20 percent impaired, limited or restricted      Problem List There are no active problems to  display for this patient.  [name], OTR/L  [phone] 02/17/2017, 2:27 PM  [place] [address] [place], [place], [address] Phone: [phone]   Fax:  [phone]  Name: Karen Nunez MRN: [id number] Date of Birth: [birth year]

## 2017-02-10 ENCOUNTER — Ambulatory Visit (HOSPITAL_COMMUNITY): Payer: Medicare Other | Attending: Orthopedic Surgery | Admitting: Occupational Therapy

## 2017-02-10 ENCOUNTER — Encounter (HOSPITAL_COMMUNITY): Payer: Self-pay | Admitting: Occupational Therapy

## 2017-02-10 DIAGNOSIS — M25531 Pain in right wrist: Secondary | ICD-10-CM

## 2017-02-10 DIAGNOSIS — M25631 Stiffness of right wrist, not elsewhere classified: Secondary | ICD-10-CM | POA: Diagnosis present

## 2017-02-10 DIAGNOSIS — R29898 Other symptoms and signs involving the musculoskeletal system: Secondary | ICD-10-CM

## 2017-02-10 NOTE — Therapy (Signed)
[place], [place], [address] Phone: [phone]   Fax:  [phone]  Occupational Therapy Treatment  Patient Details  Name: Karen Nunez MRN: [id number] Date of Birth: Aug 08, 1945 Referring Provider: Dr. Elsie Saas  Encounter Date: 02/10/2017      OT End of Session - 02/10/17 1109    Visit Number 20   Number of Visits 21   Date for OT Re-Evaluation 02/12/17   Authorization Type [employer]   Authorization Time Period Before 29th visit   Authorization - Visit Number 20   Authorization - Number of Visits 29   OT Start Time 1025   OT Stop Time 1110   OT Time Calculation (min) 45 min   Activity Tolerance Patient tolerated treatment well   Behavior During Therapy [facility] for tasks assessed/performed      Past Medical History:  Diagnosis Date  . No pertinent past medical history     Past Surgical History:  Procedure Laterality Date  . ABDOMINAL HYSTERECTOMY    . Bilateral breast lumpectomies     all benign  . COLONOSCOPY  09/25/2011   Procedure: COLONOSCOPY;  Surgeon: Danie Binder, MD;  [facility];  Service: Endoscopy;  Laterality: N/A;  10:30 AM    There were no vitals filed for this visit.      Subjective Assessment - 02/10/17 1022    Subjective  S: It's starting to feel more like itself now.    Currently in Pain? No/denies            [facility] OT Assessment - 02/10/17 1022      Assessment   Diagnosis Right distal radius fracture     Precautions   Precautions None                  OT Treatments/Exercises (OP) - 02/10/17 1023      Exercises   Exercises Wrist;Hand     Wrist Exercises   Forearm Supination Strengthening;15 reps   Bar Weights/Barbell (Forearm Supination) 4 lbs   Forearm Pronation Strengthening;15 reps   Bar Weights/Barbell (Forearm Pronation) 4 lbs   Wrist Flexion PROM;5 reps;Strengthening;15 reps   Bar Weights/Barbell (Wrist Flexion) 4 lbs    Wrist Extension PROM;5 reps;Strengthening;15 reps   Bar Weights/Barbell (Wrist Extension) 4 lbs   Wrist Radial Deviation Strengthening;15 reps   Bar Weights/Barbell (Radial Deviation) 4 lbs   Wrist Ulnar Deviation Strengthening;15 reps   Bar Weights/Barbell (Ulnar Deviation) 4 lbs     Additional Wrist Exercises   Hand Gripper with Large Beads all beads gripper at 42#   Hand Gripper with Medium Beads all beads gripper at 42#   Hand Gripper with Small Beads all beads gripper at 35#     Fine Motor Coordination   Fine Motor Coordination Grooved pegs   Grooved pegs Pt completed grooved pegboard with tweezers, working on sustained pinch strength and wrist mobility when turning keys.      Manual Therapy   Manual Therapy Myofascial release   Manual therapy comments completed separately from therapeutic exercises   Myofascial Release Myofascial release to right hand, wrist, volar and dorsal forearm to decrease pain and fascial restrictions and increase joint range of motion.                   OT Short Term Goals - 01/21/17 1612      OT SHORT TERM GOAL #1   Title Pt will be provided with and  educated on HEP to improve mobility required for improved use of RUE as dominant.    Time 4   Period Weeks     OT SHORT TERM GOAL #2   Title Pt will decrease pain to 2/10 or less in RUE to improve ability to use RUE during B/IADL completion.    Time 4   Period Weeks   Status Achieved     OT SHORT TERM GOAL #3   Title Pt will decrease RUE fascial restrictions to minimal amounts to improve mobility required for grooming tasks.    Time 4   Period Weeks     OT SHORT TERM GOAL #4   Title Pt will improve right wrist and finger A/ROM to WNL to improve ability to roll hair using RUE as dominant.    Time 4   Period Weeks   Status Partially Met     OT SHORT TERM GOAL #5   Title Pt will improve right wrist strength to 4+/5 to improve ability to complete meal preparation tasks.    Time 4    Period Weeks   Status Partially Met     OT SHORT TERM GOAL #6   Title Pt will improve R grip strength by 20# and pinch strength by 5# to improve ability to grasp and hold household items.    Time 4   Period Weeks   Status On-going                  Plan - 02/10/17 1110    Clinical Impression Statement A: Added grooved pegboard task this session to work on susatined pinch strengthening and wrist ROM with task completion. Increased time for completion, pt able to complete with minimal fatigue.    Plan P: Reassessement, update HEP, update G-Code, and discharge   OT Home Exercise Plan 6/22: finger and wrist A/ROM; 7/26: wrist strengthening   Consulted and Agree with Plan of Care Patient      Patient will benefit from skilled therapeutic intervention in order to improve the following deficits and impairments:  Decreased strength, Decreased activity tolerance, Decreased range of motion, Increased fascial restricitons, Impaired UE functional use  Visit Diagnosis: Pain in right wrist  Stiffness of right wrist, not elsewhere classified  Other symptoms and signs involving the musculoskeletal system    Problem List There are no active problems to display for this patient.  [name], OTR/L  [phone] 02/10/2017, 11:11 AM  [place] [address] [place], [place], [address] Phone: [phone]   Fax:  [phone]  Name: Karen Nunez MRN: [id number] Date of Birth: 26-Dec-1945

## 2017-02-12 ENCOUNTER — Encounter (HOSPITAL_COMMUNITY): Payer: Self-pay | Admitting: Occupational Therapy

## 2017-02-12 ENCOUNTER — Ambulatory Visit (HOSPITAL_COMMUNITY): Payer: Medicare Other | Admitting: Occupational Therapy

## 2017-02-12 DIAGNOSIS — R29898 Other symptoms and signs involving the musculoskeletal system: Secondary | ICD-10-CM

## 2017-02-12 DIAGNOSIS — M25531 Pain in right wrist: Secondary | ICD-10-CM | POA: Diagnosis not present

## 2017-02-12 DIAGNOSIS — M25631 Stiffness of right wrist, not elsewhere classified: Secondary | ICD-10-CM

## 2017-02-12 NOTE — Therapy (Signed)
[place], [place], [address] Phone: [phone]   Fax:  [phone]  Occupational Therapy Treatment  Patient Details  Name: Karen Nunez MRN: [id number] Date of Birth: November 29, 1945 Referring Provider: Dr. Elsie Saas  Encounter Date: 02/12/2017      OT End of Session - 02/12/17 1100    Visit Number 21   Number of Visits 21   Date for OT Re-Evaluation 02/12/17   Authorization Type [employer]   Authorization Time Period Before 29th visit   Authorization - Visit Number 21   Authorization - Number of Visits 29   OT Start Time 1023   OT Stop Time 1110   OT Time Calculation (min) 47 min   Activity Tolerance Patient tolerated treatment well   Behavior During Therapy [facility] for tasks assessed/performed      Past Medical History:  Diagnosis Date  . No pertinent past medical history     Past Surgical History:  Procedure Laterality Date  . ABDOMINAL HYSTERECTOMY    . Bilateral breast lumpectomies     all benign  . COLONOSCOPY  09/25/2011   Procedure: COLONOSCOPY;  Surgeon: Danie Binder, MD;  [facility];  Service: Endoscopy;  Laterality: N/A;  10:30 AM    There were no vitals filed for this visit.      Subjective Assessment - 02/12/17 1024    Subjective  S: I'm still doing my exercises.    Currently in Pain? No/denies            [facility] OT Assessment - 02/12/17 1024      Assessment   Diagnosis Right distal radius fracture     Precautions   Precautions None     Palpation   Palpation comment Trace fascial restrictions along thenar eminence, wrist, dorsal and volar forearm     AROM   Right/Left Forearm Right   Right Forearm Pronation 90 Degrees  same as previous, WNL   Right Forearm Supination 90 Degrees  same as previous, WNL   Right/Left Wrist Right   Right Wrist Extension 62 Degrees  50 previous   Right Wrist Flexion 58 Degrees  52 previous   Right Wrist Radial  Deviation 20 Degrees  same as previous; WNL   Right Wrist Ulnar Deviation 22 Degrees  same as previous; WNL     PROM   Right/Left Forearm Right   Right Wrist Extension 70 Degrees  60 previous   Right Wrist Flexion 70 Degrees  65 previous     Strength   Right/Left Forearm Right   Right Forearm Pronation 4+/5  4/5 previous   Right Forearm Supination 4+/5  4/5 previous   Right/Left Wrist Right   Right Wrist Flexion 5/5  4+/5 previous   Right Wrist Extension 5/5  4+/5 previous   Right Wrist Radial Deviation 5/5  same as previous   Right Wrist Ulnar Deviation 5/5  same as previous     Right Hand AROM   R Index  MCP 0-90 72 Degrees  62   R Index PIP 0-100 92 Degrees  80   R Index DIP 0-70 62 Degrees  58   R Long  MCP 0-90 70 Degrees  60   R Long PIP 0-100 92 Degrees  80   R Long DIP 0-70 70 Degrees  70   R Ring  MCP 0-90 68 Degrees  62   R Ring PIP 0-100 92 Degrees  84   R  Ring DIP 0-70 70 Degrees  70   R Little  MCP 0-90 62 Degrees  52   R Little PIP 0-100 90 Degrees  80   R Little DIP 0-70 76 Degrees  76     Hand Function   Right Hand Gross Grasp Functional   Right Hand Grip (lbs) 30  22 previous   Right Hand Lateral Pinch 10 lbs  6 previous   Right Hand 3 Point Pinch 8 lbs  6 previous                  OT Treatments/Exercises (OP) - 02/12/17 1024      Exercises   Exercises Wrist;Hand     Wrist Exercises   Forearm Supination PROM;5 reps   Forearm Pronation PROM;5 reps   Wrist Flexion PROM;5 reps   Wrist Extension PROM;5 reps   Wrist Radial Deviation PROM;5 reps   Wrist Ulnar Deviation PROM;5 reps     Additional Wrist Exercises   Theraputty Flatten;Roll;Grip;Pinch   Theraputty - Flatten red in standing   Theraputty - Roll red   Theraputty - Grip red   Theraputty - Pinch red                OT Education - 02/12/17 1057    Education provided Yes   Education Details educated on HEP and upgraded putty to red   Person(s)  Educated Patient   Methods Explanation   Comprehension Verbalized understanding          OT Short Term Goals - 02/12/17 1049      OT SHORT TERM GOAL #1   Title Pt will be provided with and educated on HEP to improve mobility required for improved use of RUE as dominant.    Time 4   Period Weeks     OT SHORT TERM GOAL #2   Title Pt will decrease pain to 2/10 or less in RUE to improve ability to use RUE during B/IADL completion.    Time 4   Period Weeks   Status Achieved     OT SHORT TERM GOAL #3   Title Pt will decrease RUE fascial restrictions to minimal amounts to improve mobility required for grooming tasks.    Time 4   Period Weeks     OT SHORT TERM GOAL #4   Title Pt will improve right wrist and finger A/ROM to WNL to improve ability to roll hair using RUE as dominant.    Time 4   Period Weeks   Status Achieved     OT SHORT TERM GOAL #5   Title Pt will improve right wrist strength to 4+/5 to improve ability to complete meal preparation tasks.    Time 4   Period Weeks   Status Achieved     OT SHORT TERM GOAL #6   Title Pt will improve R grip strength by 20# and pinch strength by 5# to improve ability to grasp and hold household items.    Time 4   Period Weeks   Status Achieved                  Plan - 02/12/17 1058    Clinical Impression Statement A: Reassessment completed this session, pt has met all goals and has made great progress with ROM and strength. Pt reports she is now using RUE as dominant with B/IADL tasks and is having no difficulty with task completion. Pt reports occasional fatigue and soreness with sustained use of hand  with grip tasks. Discussed HEP frequency and duration and upgraded putty to red; pt is agreeable to discharge today.    Plan P: Discharge pt   OT Home Exercise Plan 6/22: finger and wrist A/ROM; 7/26: wrist strengthening   Consulted and Agree with Plan of Care Patient      Patient will benefit from skilled therapeutic  intervention in order to improve the following deficits and impairments:  Decreased strength, Decreased activity tolerance, Decreased range of motion, Increased fascial restricitons, Impaired UE functional use  Visit Diagnosis: Pain in right wrist  Stiffness of right wrist, not elsewhere classified  Other symptoms and signs involving the musculoskeletal system      G-Codes - Feb 27, 2017 1058    Functional Assessment Tool Used (Outpatient only) clinical judgement   Functional Limitation Carrying, moving and handling objects   Carrying, Moving and Handling Objects Goal Status (C6282) At least 1 percent but less than 20 percent impaired, limited or restricted   Carrying, Moving and Handling Objects Discharge Status [phone]) At least 1 percent but less than 20 percent impaired, limited or restricted      Problem List There are no active problems to display for this patient.  [name], OTR/L  [phone] 27-Feb-2017, 11:12 AM  [place] [address] [place], [place], [address] Phone: [phone]   Fax:  [phone]  Name: LASONIA CASINO MRN: [id number]    OCCUPATIONAL THERAPY DISCHARGE SUMMARY  Visits from Start of Care: 21  Current functional level related to goals / functional outcomes: See above. Pt has made great progress with wrist ROM and strength, as well as grip and pinch strengthening. Pt is now using RUE as dominant for all ADL tasks with no difficulty.    Remaining deficits: Occasional fatigue and soreness in the right hand with sustained use.    Education / Equipment: Upgraded theraputty to red, reviewed strengthening HEP Plan: Patient agrees to discharge.  Patient goals were met. Patient is being discharged due to meeting the stated rehab goals.  ?????      Date of Birth: 1945/09/06

## 2017-02-18 ENCOUNTER — Encounter (HOSPITAL_COMMUNITY): Payer: Self-pay | Admitting: *Deleted

## 2017-02-18 ENCOUNTER — Emergency Department (HOSPITAL_COMMUNITY)
Admission: EM | Admit: 2017-02-18 | Discharge: 2017-02-18 | Disposition: A | Discharge status: Home / Self Care | Payer: Medicare Other | Attending: Emergency Medicine | Admitting: Emergency Medicine

## 2017-02-18 ENCOUNTER — Emergency Department (HOSPITAL_COMMUNITY): Payer: Medicare Other

## 2017-02-18 DIAGNOSIS — Z79899 Other long term (current) drug therapy: Secondary | ICD-10-CM | POA: Diagnosis not present

## 2017-02-18 DIAGNOSIS — N200 Calculus of kidney: Secondary | ICD-10-CM | POA: Diagnosis not present

## 2017-02-18 DIAGNOSIS — R1084 Generalized abdominal pain: Secondary | ICD-10-CM | POA: Diagnosis present

## 2017-02-18 LAB — CBC WITH DIFFERENTIAL/PLATELET
Basophils Absolute: 0 10*3/uL (ref 0.0–0.1)
Basophils Relative: 0 %
Eosinophils Absolute: 0.1 10*3/uL (ref 0.0–0.7)
Eosinophils Relative: 0 %
HCT: 37.9 % (ref 36.0–46.0)
Hemoglobin: 12.5 g/dL (ref 12.0–15.0)
Lymphocytes Relative: 15 %
Lymphs Abs: 1.6 10*3/uL (ref 0.7–4.0)
MCH: 32.4 pg (ref 26.0–34.0)
MCHC: 33 g/dL (ref 30.0–36.0)
MCV: 98.2 fL (ref 78.0–100.0)
Monocytes Absolute: 0.6 10*3/uL (ref 0.1–1.0)
Monocytes Relative: 5 %
Neutro Abs: 8.9 10*3/uL — ABNORMAL HIGH (ref 1.7–7.7)
Neutrophils Relative %: 80 %
Platelets: 182 10*3/uL (ref 150–400)
RBC: 3.86 MIL/uL — ABNORMAL LOW (ref 3.87–5.11)
RDW: 13.1 % (ref 11.5–15.5)
WBC: 11.2 10*3/uL — ABNORMAL HIGH (ref 4.0–10.5)

## 2017-02-18 LAB — URINALYSIS, ROUTINE W REFLEX MICROSCOPIC
Bilirubin Urine: NEGATIVE
Glucose, UA: NEGATIVE mg/dL
Hgb urine dipstick: NEGATIVE
Ketones, ur: NEGATIVE mg/dL
Nitrite: NEGATIVE
Protein, ur: 30 mg/dL — AB
Specific Gravity, Urine: 1.027 (ref 1.005–1.030)
pH: 5 (ref 5.0–8.0)

## 2017-02-18 LAB — COMPREHENSIVE METABOLIC PANEL
ALT: 13 U/L — ABNORMAL LOW (ref 14–54)
AST: 19 U/L (ref 15–41)
Albumin: 4.3 g/dL (ref 3.5–5.0)
Alkaline Phosphatase: 42 U/L (ref 38–126)
Anion gap: 9 (ref 5–15)
BUN: 25 mg/dL — ABNORMAL HIGH (ref 6–20)
CO2: 25 mmol/L (ref 22–32)
Calcium: 9 mg/dL (ref 8.9–10.3)
Chloride: 104 mmol/L (ref 101–111)
Creatinine, Ser: 0.9 mg/dL (ref 0.44–1.00)
GFR calc Af Amer: >60 mL/min (ref >60)
GFR calc non Af Amer: >60 mL/min (ref >60)
Glucose, Bld: 143 mg/dL — ABNORMAL HIGH (ref 65–99)
Potassium: 3.6 mmol/L (ref 3.5–5.1)
Sodium: 138 mmol/L (ref 135–145)
Total Bilirubin: 0.5 mg/dL (ref 0.3–1.2)
Total Protein: 7.2 g/dL (ref 6.5–8.1)

## 2017-02-18 MED ORDER — ONDANSETRON HCL 4 MG/2ML IJ SOLN
4.0000 mg | Freq: Once | INTRAMUSCULAR | Status: AC
  Administered 2017-02-18: 4 mg via INTRAVENOUS
  Filled 2017-02-18: qty 2

## 2017-02-18 MED ORDER — KETOROLAC TROMETHAMINE 30 MG/ML IJ SOLN
30.0000 mg | Freq: Once | INTRAMUSCULAR | Status: AC
  Administered 2017-02-18: 30 mg via INTRAVENOUS
  Filled 2017-02-18: qty 1

## 2017-02-18 MED ORDER — MORPHINE SULFATE (PF) 4 MG/ML IV SOLN
4.0000 mg | Freq: Once | INTRAVENOUS | Status: AC
  Administered 2017-02-18: 4 mg via INTRAVENOUS
  Filled 2017-02-18: qty 1

## 2017-02-18 MED ORDER — PROMETHAZINE HCL 25 MG/ML IJ SOLN
12.5000 mg | Freq: Once | INTRAMUSCULAR | Status: AC
  Administered 2017-02-18: 12.5 mg via INTRAVENOUS
  Filled 2017-02-18: qty 1

## 2017-02-18 MED ORDER — OXYCODONE-ACETAMINOPHEN 5-325 MG PO TABS: 1.0000 | ORAL_TABLET | Freq: Four times a day (QID) | ORAL | 0 refills | Status: DC | PRN

## 2017-02-18 NOTE — ED Provider Notes (Signed)
[facility] DEPT Provider Note   CSN: [id number] Arrival date & time: 02/18/17  0200     History   Chief Complaint Chief Complaint  Patient presents with  . Flank Pain    HPI Karen Nunez is a 71 y.o. female.  Patient is a 71 year old female with no significant past medical history. She presents today for evaluation of right flank pain. This started abruptly several hours ago. It is associated with an urge to go to the bathroom. She denies any fevers, chills, injury or trauma.She denies any bowel or bladder complaints.   The history is provided by the patient.  Flank Pain  This is a new problem. The current episode started 1 to 2 hours ago. The problem occurs constantly. The problem has been rapidly worsening. Pertinent negatives include no abdominal pain. Nothing aggravates the symptoms. Nothing relieves the symptoms. She has tried nothing for the symptoms.    Past Medical History:  Diagnosis Date  . No pertinent past medical history     There are no active problems to display for this patient.   Past Surgical History:  Procedure Laterality Date  . ABDOMINAL HYSTERECTOMY    . Bilateral breast lumpectomies     all benign  . COLONOSCOPY  09/25/2011   Procedure: COLONOSCOPY;  Surgeon: Danie Binder, MD;  [facility];  Service: Endoscopy;  Laterality: N/A;  10:30 AM    OB History    No data available       Home Medications    Prior to Admission medications   Medication Sig Start Date End Date Taking? Authorizing Provider  Multiple Vitamin (MULTIVITAMIN) tablet Take 1 tablet by mouth daily.    [provider]  NON FORMULARY Calcium 600 mg plus Vit D       One tablet bid    [provider]    Family History Family History  Problem Relation Age of Onset  . Colon cancer Neg Hx     Social History Social History  Substance Use Topics  . Smoking status: Never Smoker  . Smokeless tobacco: Never Used  . Alcohol use Yes   Comment: occasional     Allergies   Patient has no known allergies.   Review of Systems Review of Systems  Gastrointestinal: Negative for abdominal pain.  Genitourinary: Positive for flank pain.  All other systems reviewed and are negative.    Physical Exam Updated Vital Signs BP (!) 188/96   Pulse 89   Temp 98.1 F (36.7 C) (Oral)   Resp 18   Ht 5\' 5"  (1.651 m)   Wt 78.5 kg (173 lb)   SpO2 98%   BMI 28.79 kg/m   Physical Exam  Constitutional: She is oriented to person, place, and time. She appears well-developed and well-nourished. No distress.  She appears uncomfortable  HENT:  Head: Normocephalic and atraumatic.  Neck: Normal range of motion. Neck supple.  Cardiovascular: Normal rate and regular rhythm.  Exam reveals no gallop and no friction rub.   No murmur heard. Pulmonary/Chest: Effort normal and breath sounds normal. No respiratory distress. She has no wheezes.  Abdominal: Soft. Bowel sounds are normal. She exhibits no distension. There is no tenderness.  There is tenderness to palpation in the right flank  Musculoskeletal: Normal range of motion.  Neurological: She is alert and oriented to person, place, and time.  Skin: Skin is warm and dry. She is not diaphoretic.  Nursing note and vitals reviewed.    ED  Treatments / Results  Labs (all labs ordered are listed, but only abnormal results are displayed) Labs Reviewed  COMPREHENSIVE METABOLIC PANEL  CBC WITH DIFFERENTIAL/PLATELET  URINALYSIS, ROUTINE W REFLEX MICROSCOPIC    EKG  EKG Interpretation None       Radiology No results found.  Procedures Procedures (including critical care time)  Medications Ordered in ED Medications  ondansetron (ZOFRAN) injection 4 mg (not administered)  morphine 4 MG/ML injection 4 mg (not administered)  ketorolac (TORADOL) 30 MG/ML injection 30 mg (not administered)     Initial Impression / Assessment and Plan / ED Course  I have reviewed the triage  vital signs and the nursing notes.  Pertinent labs & imaging results that were available during my care of the patient were reviewed by me and considered in my medical decision making (see chart for details).  CT scan confirms a 3 mm stone in the right UVJ. She is feeling better after medications given in the ER. She will be discharged with pain medicine and when necessary follow-up with urology.  Final Clinical Impressions(s) / ED Diagnoses   Final diagnoses:  None    New Prescriptions New Prescriptions   No medications on file     Veryl Speak, MD 02/18/17 [phone]

## 2017-02-18 NOTE — Discharge Instructions (Signed)
Percocet as prescribed as needed for pain.  Follow-up with urology if your symptoms are not improving in the next 3-4 days. The contact information for [facility] has been provided in this discharge summary for you to call and make these arrangements.  Return to the emergency department in the meantime if you develop worsening pain, high fevers, or other new and concerning symptoms.

## 2017-02-18 NOTE — ED Triage Notes (Signed)
Pt c/o pain to right flank area; pt states the pain woke her up and she went to bathroom and had a BM that was normal; pt states when she woke up yesterday am when she went to urinate it "felt strange; pt has been vomiting this evening

## 2017-02-19 ENCOUNTER — Other Ambulatory Visit (HOSPITAL_COMMUNITY): Payer: Self-pay | Admitting: Pulmonary Disease

## 2017-02-19 DIAGNOSIS — Z780 Asymptomatic menopausal state: Secondary | ICD-10-CM

## 2017-02-24 ENCOUNTER — Ambulatory Visit (HOSPITAL_COMMUNITY)
Admission: RE | Admit: 2017-02-24 | Discharge: 2017-02-24 | Disposition: A | Discharge status: Home / Self Care | Payer: Medicare Other | Source: Ambulatory Visit | Attending: Pulmonary Disease | Admitting: Pulmonary Disease

## 2017-02-24 DIAGNOSIS — N951 Menopausal and female climacteric states: Secondary | ICD-10-CM | POA: Diagnosis present

## 2017-02-24 DIAGNOSIS — Z780 Asymptomatic menopausal state: Secondary | ICD-10-CM

## 2017-02-24 DIAGNOSIS — M8580 Other specified disorders of bone density and structure, unspecified site: Secondary | ICD-10-CM | POA: Diagnosis not present

## 2017-02-24 LAB — HM DEXA SCAN

## 2017-03-08 ENCOUNTER — Telehealth: Payer: Self-pay

## 2017-03-08 NOTE — Telephone Encounter (Signed)
[phone]  Patient called to schedule tcs  No current gi issues

## 2017-03-09 ENCOUNTER — Telehealth: Payer: Self-pay

## 2017-03-09 NOTE — Telephone Encounter (Signed)
LMOM to call.

## 2017-03-09 NOTE — Telephone Encounter (Signed)
See separate triage.  

## 2017-03-09 NOTE — Telephone Encounter (Addendum)
Gastroenterology Pre-Procedure Review  Request Date: 04/16/2017 Requesting Physician: Dr. Luan Pulling  PATIENT REVIEW QUESTIONS: The patient responded to the following health history questions as indicated:    Pt's last colonoscopy was with Dr. Oneida Alar in 2013  Next recommended in 10 years ( on Recall for 2023).  However, her mom was diagnosed with cancer at age 70 She had previously thought her mom had ovarian cancer  1. Diabetes Melitis: no 2. Joint replacements in the past 12 months: no 3. Major health problems in the past 3 months: no 4. Has an artificial valve or MVP: no 5. Has a defibrillator: no 6. Has been advised in past to take antibiotics in advance of a procedure like teeth cleaning: no 7. Family history of colon cancer: Yes, mother diagnosed at age 59 8. Alcohol Use: Rarely 9. History of sleep apnea: no  10. History of coronary artery or other vascular stents placed within the last 12 months: no 11. History of any prior anesthesia complications: no    MEDICATIONS & ALLERGIES:    Patient reports the following regarding taking any blood thinners:   Plavix? no Aspirin? no Coumadin? no Brilinta? no Xarelto? no Eliquis? no Pradaxa? no Savaysa? no Effient? no  Patient confirms/reports the following medications:  Current Outpatient Prescriptions  Medication Sig Dispense Refill  . Multiple Vitamin (MULTIVITAMIN) tablet Take 1 tablet by mouth daily.    . NON FORMULARY Calcium 600 mg plus Vit D       One tablet bid     No current facility-administered medications for this visit.     Patient confirms/reports the following allergies:  No Known Allergies  No orders of the defined types were placed in this encounter.   AUTHORIZATION INFORMATION Primary Insurance:   ID #:   Group #:  Pre-Cert / Auth required: Pre-Cert / Auth #:   Secondary Insurance:   ID #:   Group #:  Pre-Cert / Auth required: Pre-Cert / Auth #:   SCHEDULE INFORMATION: Procedure has been  scheduled as follows:  Date: 04/16/2017                 Time:1:00 pm Location: [facility] Short Stay  This Gastroenterology Pre-Precedure Review Form is being routed to the following provider(s): Dr. Oneida Alar

## 2017-03-10 NOTE — Telephone Encounter (Signed)
Dr. Oneida Alar, please advise when she would be due for colonoscopy.  When she had colonoscopy in 2013 she was not aware Mom had colon cancer ( diagnosed at age 70). She had previously thought it was ovarian cancer that she had and was on Recall for 2023.

## 2017-03-10 NOTE — Telephone Encounter (Signed)
Please clarify, what kind of cancer did mom have.  Not clear on triage sheet. #7 says no fh colon cancer as well.   If mom had colon cancer, please address with [facility] regarding timing of next TCS. FH of CRC at age >60 may not change timing of patient's next TCS, although it is right on border.

## 2017-03-10 NOTE — Telephone Encounter (Signed)
Magda Paganini, I corrected that. She said mother was diagnosed with colon cancer at age 70.

## 2017-03-10 NOTE — Telephone Encounter (Signed)
PLEASE CALL PT. BASED ON HER PREVIOUS COLONOSCOPY & her family history she can have a TCS IN 2018. PHONE [place], clear liquids on day prior to TCS.

## 2017-03-11 NOTE — Telephone Encounter (Signed)
We do not have plenvu and no template for it yet. Please advise what other prep?

## 2017-03-15 NOTE — Telephone Encounter (Signed)
SUPREP.

## 2017-03-16 ENCOUNTER — Other Ambulatory Visit: Payer: Self-pay

## 2017-03-16 DIAGNOSIS — Z800 Family history of malignant neoplasm of digestive organs: Secondary | ICD-10-CM

## 2017-03-16 MED ORDER — NA SULFATE-K SULFATE-MG SULF 17.5-3.13-1.6 GM/177ML PO SOLN
1.0000 | ORAL | 0 refills | Status: DC
Start: 2017-03-16 — End: 2017-04-19

## 2017-03-16 NOTE — Telephone Encounter (Signed)
PT is now scheduled for 04/19/2017 at 12:45 pm with Dr. Oneida Alar.

## 2017-03-16 NOTE — Telephone Encounter (Signed)
Rx sent to the pharmacy and instructions mailed to pt.  

## 2017-04-19 ENCOUNTER — Ambulatory Visit (HOSPITAL_COMMUNITY)
Admission: RE | Admit: 2017-04-19 | Discharge: 2017-04-19 | Disposition: A | Discharge status: Home / Self Care | Payer: Medicare Other | Source: Ambulatory Visit | Attending: Gastroenterology | Admitting: Gastroenterology

## 2017-04-19 ENCOUNTER — Encounter (HOSPITAL_COMMUNITY): Payer: Self-pay | Admitting: *Deleted

## 2017-04-19 ENCOUNTER — Encounter (HOSPITAL_COMMUNITY)
Admission: RE | Disposition: A | Discharge status: Home / Self Care | Payer: Self-pay | Source: Ambulatory Visit | Attending: Gastroenterology

## 2017-04-19 ENCOUNTER — Other Ambulatory Visit: Payer: Self-pay

## 2017-04-19 DIAGNOSIS — Z79899 Other long term (current) drug therapy: Secondary | ICD-10-CM | POA: Diagnosis not present

## 2017-04-19 DIAGNOSIS — K644 Residual hemorrhoidal skin tags: Secondary | ICD-10-CM | POA: Insufficient documentation

## 2017-04-19 DIAGNOSIS — K5730 Diverticulosis of large intestine without perforation or abscess without bleeding: Secondary | ICD-10-CM | POA: Diagnosis not present

## 2017-04-19 DIAGNOSIS — Z860100 Personal history of colon polyps, unspecified: Secondary | ICD-10-CM

## 2017-04-19 DIAGNOSIS — K621 Rectal polyp: Secondary | ICD-10-CM | POA: Diagnosis not present

## 2017-04-19 DIAGNOSIS — Z800 Family history of malignant neoplasm of digestive organs: Secondary | ICD-10-CM | POA: Diagnosis not present

## 2017-04-19 DIAGNOSIS — K648 Other hemorrhoids: Secondary | ICD-10-CM | POA: Diagnosis not present

## 2017-04-19 DIAGNOSIS — D128 Benign neoplasm of rectum: Secondary | ICD-10-CM

## 2017-04-19 DIAGNOSIS — Z1211 Encounter for screening for malignant neoplasm of colon: Secondary | ICD-10-CM | POA: Insufficient documentation

## 2017-04-19 DIAGNOSIS — Z90710 Acquired absence of both cervix and uterus: Secondary | ICD-10-CM | POA: Insufficient documentation

## 2017-04-19 HISTORY — DX: Benign neoplasm of colon, unspecified: D12.6

## 2017-04-19 HISTORY — PX: COLONOSCOPY: SHX5424

## 2017-04-19 LAB — HM COLONOSCOPY

## 2017-04-19 SURGERY — COLONOSCOPY
Anesthesia: Moderate Sedation

## 2017-04-19 MED ORDER — MIDAZOLAM HCL 5 MG/5ML IJ SOLN
INTRAMUSCULAR | Status: AC
  Filled 2017-04-19: qty 10

## 2017-04-19 MED ORDER — MIDAZOLAM HCL 5 MG/5ML IJ SOLN
INTRAMUSCULAR | Status: DC | PRN
  Administered 2017-04-19 (×2): 2 mg via INTRAVENOUS

## 2017-04-19 MED ORDER — MEPERIDINE HCL 100 MG/ML IJ SOLN
INTRAMUSCULAR | Status: DC | PRN
  Administered 2017-04-19: 25 mg
  Administered 2017-04-19: 50 mg

## 2017-04-19 MED ORDER — SODIUM CHLORIDE 0.9 % IV SOLN
INTRAVENOUS | Status: DC
  Administered 2017-04-19: 12:00:00 via INTRAVENOUS

## 2017-04-19 MED ORDER — MEPERIDINE HCL 100 MG/ML IJ SOLN
INTRAMUSCULAR | Status: AC
  Filled 2017-04-19: qty 2

## 2017-04-19 NOTE — Op Note (Signed)
[facility] Patient Name: Karen Nunez Procedure Date: 04/19/2017 11:40 AM MRN: [id number] Date of Birth: 09-17-1945 Attending MD: Barney Drain MD, MD CSN: [id number] Age: 71 Admit Type: Outpatient Procedure:                Colonoscopy WITH COLD SNARE POLYPECTOMY Indications:              Family history of colon cancer in a first-degree                            relative: AGE 48(MOTHER) & Personal history of                            colonic polyps(SIMPLE ADENOMA x1 REMOVED 2012) Providers:                Barney Drain MD, MD, Janeece Riggers, RN, Aram Candela Referring MD:             Jasper Loser. Luan Pulling MD, MD Medicines:                Meperidine 75 mg IV, Midazolam 4 mg IV Complications:            No immediate complications. Estimated Blood Loss:     Estimated blood loss was minimal. Procedure:                Pre-Anesthesia Assessment:                           - Prior to the procedure, a History and Physical                            was performed, and patient medications and                            allergies were reviewed. The patient's tolerance of                            previous anesthesia was also reviewed. The risks                            and benefits of the procedure and the sedation                            options and risks were discussed with the patient.                            All questions were answered, and informed consent                            was obtained. Prior Anticoagulants: The patient has                            taken ibuprofen, last dose was 3 days prior to                            procedure. ASA Grade Assessment: I - A normal,  healthy patient. After reviewing the risks and                            benefits, the patient was deemed in satisfactory                            condition to undergo the procedure. After obtaining                            informed consent, the colonoscope was passed under                             direct vision. Throughout the procedure, the                            patient's blood pressure, pulse, and oxygen                            saturations were monitored continuously. The                            EC-[id number] ([id number]) scope was introduced through                            the anus and advanced to the the cecum, identified                            by appendiceal orifice and ileocecal valve. The                            colonoscopy was technically difficult and complex                            due to a tortuous colon. Successful completion of                            the procedure was aided by COLOWRAP. The patient                            tolerated the procedure fairly well. The quality of                            the bowel preparation was good. The ileocecal                            valve, appendiceal orifice, and rectum were                            photographed. Scope In: 1:02:39 PM Scope Out: 1:20:06 PM Scope Withdrawal Time: 0 hours 12 minutes 35 seconds  Total Procedure Duration: 0 hours 17 minutes 27 seconds  Findings:      A 4 mm polyp was found in the rectum. The polyp was sessile. The polyp       was  removed with a cold snare. Resection and retrieval were complete.      Multiple small and large-mouthed diverticula were found in the entire       colon.      External and internal hemorrhoids were found during retroflexion. The       hemorrhoids were moderate. Impression:               - One 4 mm polyp in the rectum, removed with a cold                            snare. Resected and retrieved.                           - Diverticulosis in the entire examined colon.                           - External and internal hemorrhoids. Moderate Sedation:      Moderate (conscious) sedation was administered by the endoscopy nurse       and supervised by the endoscopist. The following parameters were       monitored: oxygen  saturation, heart rate, blood pressure, and response       to care. Total physician intraservice time was 26 minutes. Recommendation:           - Repeat colonoscopy in 5-10 years for surveillance.                           - High fiber diet.                           - Continue present medications.                           - Await pathology results.                           - Patient has a contact number available for                            emergencies. The signs and symptoms of potential                            delayed complications were discussed with the                            patient. Return to normal activities tomorrow.                            Written discharge instructions were provided to the                            patient. Procedure Code(s):        --- Professional ---                           [phone], Colonoscopy, flexible; with removal of  tumor(s), polyp(s), or other lesion(s) by snare                            technique                           99152, Moderate sedation services provided by the                            same physician or other qualified health care                            professional performing the diagnostic or                            therapeutic service that the sedation supports,                            requiring the presence of an independent trained                            observer to assist in the monitoring of the                            patient's level of consciousness and physiological                            status; initial 15 minutes of intraservice time,                            patient age 67 years or older                           [phone], Moderate sedation services; each additional                            15 minutes intraservice time Diagnosis Code(s):        --- Professional ---                           K62.1, Rectal polyp                           K64.8, Other  hemorrhoids                           Z80.0, Family history of malignant neoplasm of                            digestive organs                           Z86.010, Personal history of colonic polyps                           K57.30, Diverticulosis of large intestine without  perforation or abscess without bleeding CPT copyright 2016 American Medical Association. All rights reserved. The codes documented in this report are preliminary and upon coder review may  be revised to meet current compliance requirements. Barney Drain, MD Barney Drain MD, MD 04/19/2017 1:32:10 PM This report has been signed electronically. Number of Addenda: 0

## 2017-04-19 NOTE — Discharge Instructions (Signed)
YOU had ONE POLYP REMOVED.  You have MODERATE internal and external hemorrhoids and diverticulosis in your RIGHT AND LEFT  colon.   DRINK WATER TO KEEP YOUR URINE LIGHT YELLOW.  FOLLOW A HIGH FIBER DIET. AVOID ITEMS THAT CAUSE BLOATING & GAS. See info below.  YOUR BIOPSY RESULTS WILL BE AVAILABLE IN MY CHART AFTER NOV 12 AND MY OFFICE WILL CONTACT YOU IN 10-14 DAYS WITH YOUR RESULTS.   Next colonoscopy in  BETWEEN 2023 AND 2025(5-10 YEARS). Colonoscopy Care After Read the instructions outlined below and refer to this sheet in the next week. These discharge instructions provide you with general information on caring for yourself after you leave the hospital. While your treatment has been planned according to the most current medical practices available, unavoidable complications occasionally occur. If you have any problems or questions after discharge, call DR. Aliyha Fornes, [phone].  ACTIVITY  You may resume your regular activity, but move at a slower pace for the next 24 hours.   Take frequent rest periods for the next 24 hours.   Walking will help get rid of the air and reduce the bloated feeling in your belly (abdomen).   No driving for 24 hours (because of the medicine (anesthesia) used during the test).   You may shower.   Do not sign any important legal documents or operate any machinery for 24 hours (because of the anesthesia used during the test).    NUTRITION  Drink plenty of fluids.   You may resume your normal diet as instructed by your doctor.   Begin with a light meal and progress to your normal diet. Heavy or fried foods are harder to digest and may make you feel sick to your stomach (nauseated).   Avoid alcoholic beverages for 24 hours or as instructed.    MEDICATIONS  You may resume your normal medications.   WHAT YOU CAN EXPECT TODAY  Some feelings of bloating in the abdomen.   Passage of more gas than usual.   Spotting of blood in your stool or on  the toilet paper  .  IF YOU HAD POLYPS REMOVED DURING THE COLONOSCOPY:  Eat a soft diet IF YOU HAVE NAUSEA, BLOATING, ABDOMINAL PAIN, OR VOMITING.    FINDING OUT THE RESULTS OF YOUR TEST Not all test results are available during your visit. DR. Oneida Alar WILL CALL YOU WITHIN 14 DAYS OF YOUR PROCEDUE WITH YOUR RESULTS. Do not assume everything is normal if you have not heard from DR. Silvia Hightower, CALL HER OFFICE AT [phone].  SEEK IMMEDIATE MEDICAL ATTENTION AND CALL THE OFFICE: [phone] IF:  You have more than a spotting of blood in your stool.   Your belly is swollen (abdominal distention).   You are nauseated or vomiting.   You have a temperature over 101F.   You have abdominal pain or discomfort that is severe or gets worse throughout the day.  High-Fiber Diet A high-fiber diet changes your normal diet to include more whole grains, legumes, fruits, and vegetables. Changes in the diet involve replacing refined carbohydrates with unrefined foods. The calorie level of the diet is essentially unchanged. The Dietary Reference Intake (recommended amount) for adult males is 38 grams per day. For adult females, it is 25 grams per day. Pregnant and lactating women should consume 28 grams of fiber per day. Fiber is the intact part of a plant that is not broken down during digestion. Functional fiber is fiber that has been isolated from the plant to provide a beneficial  effect in the body. PURPOSE  Increase stool bulk.   Ease and regulate bowel movements.   Lower cholesterol.   REDUCE RISK OF COLON CANCER   INDICATIONS THAT YOU NEED MORE FIBER  Constipation and hemorrhoids.   Uncomplicated diverticulosis (intestine condition) and irritable bowel syndrome.   Weight management.   As a protective measure against hardening of the arteries (atherosclerosis), diabetes, and cancer.   GUIDELINES FOR INCREASING FIBER IN THE DIET  Start adding fiber to the diet slowly. A gradual  increase of about 5 more grams (2 slices of whole-wheat bread, 2 servings of most fruits or vegetables, or 1 bowl of high-fiber cereal) per day is best. Too rapid an increase in fiber may result in constipation, flatulence, and bloating.   Drink enough water and fluids to keep your urine clear or pale yellow. Water, juice, or caffeine-free drinks are recommended. Not drinking enough fluid may cause constipation.   Eat a variety of high-fiber foods rather than one type of fiber.   Try to increase your intake of fiber through using high-fiber foods rather than fiber pills or supplements that contain small amounts of fiber.   The goal is to change the types of food eaten. Do not supplement your present diet with high-fiber foods, but replace foods in your present diet.   INCLUDE A VARIETY OF FIBER SOURCES  Replace refined and processed grains with whole grains, canned fruits with fresh fruits, and incorporate other fiber sources. White rice, white breads, and most bakery goods contain little or no fiber.   Brown whole-grain rice, buckwheat oats, and many fruits and vegetables are all good sources of fiber. These include: broccoli, Brussels sprouts, cabbage, cauliflower, beets, sweet potatoes, white potatoes (skin on), carrots, tomatoes, eggplant, squash, berries, fresh fruits, and dried fruits.   Cereals appear to be the richest source of fiber. Cereal fiber is found in whole grains and bran. Bran is the fiber-rich outer coat of cereal grain, which is largely removed in refining. In whole-grain cereals, the bran remains. In breakfast cereals, the largest amount of fiber is found in those with "bran" in their names. The fiber content is sometimes indicated on the label.   You may need to include additional fruits and vegetables each day.   In baking, for 1 cup white flour, you may use the following substitutions:   1 cup whole-wheat flour minus 2 tablespoons.   1/2 cup white flour plus 1/2 cup  whole-wheat flour.   Diverticulosis Diverticulosis is a common condition that develops when small pouches (diverticula) form in the wall of the colon. The risk of diverticulosis increases with age. It happens more often in people who eat a low-fiber diet. Most individuals with diverticulosis have no symptoms. Those individuals with symptoms usually experience belly (abdominal) pain, constipation, or loose stools (diarrhea).  HOME CARE INSTRUCTIONS  Increase the amount of fiber in your diet as directed by your caregiver or dietician. This may reduce symptoms of diverticulosis.   Drink at least 6 to 8 glasses of water each day to prevent constipation.   Try not to strain when you have a bowel movement.   Avoiding nuts and seeds to prevent complications is NOT NECESSARY.      FOODS HAVING HIGH FIBER CONTENT INCLUDE:  Fruits. Apple, peach, pear, tangerine, raisins, prunes.   Vegetables. Brussels sprouts, asparagus, broccoli, cabbage, carrot, cauliflower, romaine lettuce, spinach, summer squash, tomato, winter squash, zucchini.   Starchy Vegetables. Baked beans, kidney beans, lima beans, split peas, lentils,  potatoes (with skin).   Grains. Whole wheat bread, brown rice, bran flake cereal, plain oatmeal, white rice, shredded wheat, bran muffins.    SEEK IMMEDIATE MEDICAL CARE IF:  You develop increasing pain or severe bloating.   You have an oral temperature above 101F.   You develop vomiting or bowel movements that are bloody or black.   Hemorrhoids Hemorrhoids are dilated (enlarged) veins around the rectum. Sometimes clots will form in the veins. This makes them swollen and painful. These are called thrombosed hemorrhoids. Causes of hemorrhoids include:  Constipation.   Straining to have a bowel movement.   HEAVY LIFTING  HOME CARE INSTRUCTIONS  Eat a well balanced diet and drink 6 to 8 glasses of water every day to avoid constipation. You may also use a bulk laxative.    Avoid straining to have bowel movements.   Keep anal area dry and clean.   Do not use a donut shaped pillow or sit on the toilet for long periods. This increases blood pooling and pain.   Move your bowels when your body has the urge; this will require less straining and will decrease pain and pressure.

## 2017-04-19 NOTE — H&P (Signed)
  Primary Care Physician:  Sinda Du, MD Primary Gastroenterologist:  Dr. Oneida Alar  Pre-Procedure History & Physical: HPI:  Karen Nunez is a 71 y.o. female here for  PERSONAL HISTORY OF POLYPS/ FAMILY Hx COLON CA-MOTHER HAD COLON CA AGE > 60.    Past Medical History:  Diagnosis Date  . Medical history non-contributory   . No pertinent past medical history     Past Surgical History:  Procedure Laterality Date  . ABDOMINAL HYSTERECTOMY    . Bilateral breast lumpectomies     all benign    Prior to Admission medications   Medication Sig Start Date End Date Taking? Authorizing Provider  Calcium Carb-Cholecalciferol (CALCIUM 600 + D PO) Take 1 tablet daily by mouth.   Yes [provider]  ibuprofen (ADVIL,MOTRIN) 200 MG tablet Take 400 mg daily as needed by mouth for headache or moderate pain.   Yes [provider]  Lysine 1000 MG TABS Take 1,000 mg daily by mouth.   Yes [provider]  Na Sulfate-K Sulfate-Mg Sulf (SUPREP BOWEL PREP KIT) 17.5-3.13-1.6 GM/180ML SOLN Take 1 kit by mouth as directed. 03/16/17  Yes Danie Binder, MD    Allergies as of 03/16/2017  . (No Known Allergies)    Family History  Problem Relation Age of Onset  . Colon cancer Mother     Social History   Socioeconomic History  . Marital status: Married    Spouse name: Not on file  . Number of children: Not on file  . Years of education: Not on file  . Highest education level: Not on file  Social Needs  . Financial resource strain: Not on file  . Food insecurity - worry: Not on file  . Food insecurity - inability: Not on file  . Transportation needs - medical: Not on file  . Transportation needs - non-medical: Not on file  Occupational History  . Not on file  Tobacco Use  . Smoking status: Never Smoker  . Smokeless tobacco: Never Used  Substance and Sexual Activity  . Alcohol use: Yes    Comment: occasional  . Drug use: No  . Sexual activity: Not on file   Other Topics Concern  . Not on file  Social History Narrative  . Not on file    Review of Systems: See HPI, otherwise negative ROS   Physical Exam: BP (!) 149/88   Pulse (!) 106   Temp 98.4 F (36.9 C) (Oral)   Resp 18   Ht _0  (1.651 m)   Wt 168 lb (76.2 kg)   SpO2 99%   BMI 27.96 kg/m  General:   Alert,  pleasant and cooperative in NAD Head:  Normocephalic and atraumatic. Neck:  Supple; Lungs:  Clear throughout to auscultation.    Heart:  Regular rate and rhythm. Abdomen:  Soft, nontender and nondistended. Normal bowel sounds, without guarding, and without rebound.   Neurologic:  Alert and  oriented x4;  grossly normal neurologically.  Impression/Plan:    PERSONAL HISTORY OF POLYPS/ FAMILY Hx COLON CA-MOTHER HAD COLON CA AGE > 60.  PERSONAL HISTORY OF POLYPS.  PLAN: 1. TCS TODAY DISCUSSED PROCEDURE, BENEFITS, & RISKS: < 1% chance of medication reaction, bleeding, perforation, or rupture of spleen/liver.

## 2017-04-20 NOTE — Progress Notes (Signed)
Pt is aware.  

## 2017-04-20 NOTE — Progress Notes (Signed)
LMOM to call.

## 2017-04-22 ENCOUNTER — Encounter (HOSPITAL_COMMUNITY): Payer: Self-pay | Admitting: Gastroenterology

## 2018-01-27 LAB — LIPID PANEL
Cholesterol: 150 (ref 0–200)
LDL Cholesterol: 79
Triglycerides: 68 (ref 40–160)

## 2018-01-27 LAB — BASIC METABOLIC PANEL: Glucose: 88

## 2018-02-16 LAB — LIPID PANEL
Cholesterol: 140 (ref 0–200)
Triglycerides: 65 (ref 40–160)

## 2018-02-16 LAB — TSH: TSH: 2.16 (ref <=5.90)

## 2018-02-19 LAB — LIPID PANEL: LDL Cholesterol: 71

## 2019-01-03 LAB — HM MAMMOGRAPHY: HM Mammogram: NORMAL (ref 0–4)

## 2019-01-31 LAB — LIPID PANEL
Cholesterol: 137 (ref 0–200)
HDL: 60 (ref 35–70)
LDL Cholesterol: 68
Triglycerides: 44 (ref 40–160)

## 2019-02-15 ENCOUNTER — Other Ambulatory Visit (HOSPITAL_COMMUNITY): Payer: Self-pay | Admitting: Pulmonary Disease

## 2019-02-15 DIAGNOSIS — M8580 Other specified disorders of bone density and structure, unspecified site: Secondary | ICD-10-CM

## 2019-02-27 ENCOUNTER — Other Ambulatory Visit: Payer: Self-pay

## 2019-02-27 ENCOUNTER — Ambulatory Visit (HOSPITAL_COMMUNITY)
Admission: RE | Admit: 2019-02-27 | Discharge: 2019-02-27 | Disposition: A | Discharge status: Home / Self Care | Payer: Medicare Other | Source: Ambulatory Visit | Attending: Pulmonary Disease | Admitting: Pulmonary Disease

## 2019-02-27 DIAGNOSIS — Z13820 Encounter for screening for osteoporosis: Secondary | ICD-10-CM | POA: Diagnosis not present

## 2019-02-27 DIAGNOSIS — Z780 Asymptomatic menopausal state: Secondary | ICD-10-CM | POA: Insufficient documentation

## 2019-02-27 DIAGNOSIS — M8580 Other specified disorders of bone density and structure, unspecified site: Secondary | ICD-10-CM

## 2019-02-27 LAB — HM DEXA SCAN: HM Dexa Scan: ABNORMAL

## 2019-03-09 ENCOUNTER — Other Ambulatory Visit: Payer: Self-pay

## 2019-03-09 ENCOUNTER — Ambulatory Visit: Payer: Medicare Other | Admitting: Family Medicine

## 2019-03-09 ENCOUNTER — Encounter: Payer: Self-pay | Admitting: Family Medicine

## 2019-03-09 VITALS — BP 158/94 | HR 89 | Temp 98.0°F | Ht 64.5 in | Wt 164.6 lb

## 2019-03-09 DIAGNOSIS — Z86010 Personal history of colonic polyps: Secondary | ICD-10-CM

## 2019-03-09 DIAGNOSIS — R030 Elevated blood-pressure reading, without diagnosis of hypertension: Secondary | ICD-10-CM | POA: Diagnosis not present

## 2019-03-09 DIAGNOSIS — M8580 Other specified disorders of bone density and structure, unspecified site: Secondary | ICD-10-CM | POA: Diagnosis not present

## 2019-03-09 NOTE — Progress Notes (Signed)
New Patient Office Visit  Subjective:  Patient ID: Karen Nunez, female    DOB: [birth year]  Age: 73 y.o. MRN: [id number]  CC:  Chief Complaint  Patient presents with  . Establish Care  osteopenia   HPI Karen Nunez presents for osteopenia  Past Medical History:  Diagnosis Date  . Adenomatous colon polyp 2012  . Cataract   . Osteoporosis     Past Surgical History:  Procedure Laterality Date  . ABDOMINAL HYSTERECTOMY    . Bilateral breast lumpectomies     all benign  . BREAST SURGERY    . CESAREAN SECTION    . COLONOSCOPY  09/25/2011   Procedure: COLONOSCOPY;  Surgeon: Danie Binder, MD;  [facility];  Service: Endoscopy;  Laterality: N/A;  10:30 AM  . COLONOSCOPY N/A 04/19/2017   Procedure: COLONOSCOPY;  Surgeon: Danie Binder, MD;  [facility];  Service: Endoscopy;  Laterality: N/A;  12:45 pm    Family History  Problem Relation Age of Onset  . Colon cancer Mother 47       mets to liver  . Cancer Mother   . Stroke Father   . Stroke Sister   . Diabetes Brother   . Colon polyps Neg Hx     Social History   Socioeconomic History  . Marital status: Married    Spouse name: Not on file  . Number of children: Not on file  . Years of education: Not on file  . Highest education level: Not on file  Occupational History  . Occupation: retired  Scientific laboratory technician  . Financial resource strain: Not on file  . Food insecurity    Worry: Not on file    Inability: Not on file  . Transportation needs    Medical: Not on file    Non-medical: Not on file  Tobacco Use  . Smoking status: Never Smoker  . Smokeless tobacco: Never Used  Substance and Sexual Activity  . Alcohol use: Yes    Comment: occasional  . Drug use: No  . Sexual activity: Not on file  Lifestyle  . Physical activity    Days per week: Not on file    Minutes per session: Not on file  . Stress: Not on file  Relationships  . Social Herbalist on phone: Not on file     Gets together: Not on file    Attends religious service: Not on file    Active member of club or organization: Not on file    Attends meetings of clubs or organizations: Not on file    Relationship status: Not on file  . Intimate partner violence    Fear of current or ex partner: Not on file    Emotionally abused: Not on file    Physically abused: Not on file    Forced sexual activity: Not on file  Other Topics Concern  . Not on file  Social History Narrative  . Not on file    ROS Review of Systems  Constitutional: Negative.   HENT: Negative.   Eyes: Negative.   Respiratory: Negative.   Gastrointestinal: Negative.   Endocrine: Negative.   Genitourinary: Negative.   Musculoskeletal:       Osteopenia-DEXA  Skin: Negative.   Allergic/Immunologic: Positive for environmental allergies.  Neurological: Negative.   Hematological: Negative.   Psychiatric/Behavioral: Negative.     Objective:   Today's Vitals: BP (!) 158/94 (BP Location: Right Arm, Patient Position:  Sitting, Cuff Size: Normal)   Pulse 89   Temp 98 F (36.7 C) (Oral)   Ht 5' 4.5" (1.638 m)   Wt 164 lb 9.6 oz (74.7 kg)   SpO2 96%   BMI 27.82 kg/m   Physical Exam Vitals signs reviewed.  Constitutional:      Appearance: Normal appearance. She is normal weight.  HENT:     Head: Normocephalic and atraumatic.     Right Ear: Tympanic membrane, ear canal and external ear normal.     Left Ear: Tympanic membrane, ear canal and external ear normal.     Nose: Nose normal.     Mouth/Throat:     Mouth: Mucous membranes are dry.  Cardiovascular:     Rate and Rhythm: Normal rate and regular rhythm.     Pulses: Normal pulses.     Heart sounds: Normal heart sounds.  Pulmonary:     Effort: Pulmonary effort is normal.     Breath sounds: Normal breath sounds.  Musculoskeletal: Normal range of motion.  Skin:    General: Skin is dry.  Neurological:     Mental Status: She is alert and oriented to person, place,  and time.  Psychiatric:        Mood and Affect: Mood normal.        Behavior: Behavior normal.     Assessment & Plan:  1. Personal history of colonic polyps Colonoscopy completed  2. Osteopenia, unspecified location Vit D/Ca  3. Elevated BP without diagnosis of hypertension Check bp at home, no h/o of elevation in the past  Outpatient Encounter Medications as of 03/09/2019  Medication Sig  . Calcium Carb-Cholecalciferol (CALCIUM 600 + D PO) Take 600 tablets by mouth 2 (two) times daily.   Marland Kitchen ibuprofen (ADVIL,MOTRIN) 200 MG tablet Take 400 mg daily as needed by mouth for headache or moderate pain.  Marland Kitchen Lysine 1000 MG TABS Take 1,000 mg daily by mouth.   No facility-administered encounter medications on file as of 03/09/2019.     Follow-up: No follow-ups on file.    Hannah Beat, MD

## 2020-03-01 DIAGNOSIS — H40003 Preglaucoma, unspecified, bilateral: Secondary | ICD-10-CM | POA: Diagnosis not present

## 2020-03-04 DIAGNOSIS — Z7982 Long term (current) use of aspirin: Secondary | ICD-10-CM | POA: Diagnosis not present

## 2020-03-04 DIAGNOSIS — Z823 Family history of stroke: Secondary | ICD-10-CM | POA: Diagnosis not present

## 2020-03-04 DIAGNOSIS — Z8249 Family history of ischemic heart disease and other diseases of the circulatory system: Secondary | ICD-10-CM | POA: Diagnosis not present

## 2020-03-04 DIAGNOSIS — Z833 Family history of diabetes mellitus: Secondary | ICD-10-CM | POA: Diagnosis not present

## 2020-03-04 DIAGNOSIS — Z811 Family history of alcohol abuse and dependence: Secondary | ICD-10-CM | POA: Diagnosis not present

## 2020-03-04 DIAGNOSIS — Z809 Family history of malignant neoplasm, unspecified: Secondary | ICD-10-CM | POA: Diagnosis not present

## 2020-04-01 DIAGNOSIS — I10 Essential (primary) hypertension: Secondary | ICD-10-CM | POA: Diagnosis not present

## 2020-04-01 DIAGNOSIS — Z0000 Encounter for general adult medical examination without abnormal findings: Secondary | ICD-10-CM | POA: Diagnosis not present

## 2020-04-01 DIAGNOSIS — E782 Mixed hyperlipidemia: Secondary | ICD-10-CM | POA: Diagnosis not present

## 2020-04-04 DIAGNOSIS — Z0001 Encounter for general adult medical examination with abnormal findings: Secondary | ICD-10-CM | POA: Diagnosis not present

## 2020-04-04 DIAGNOSIS — H269 Unspecified cataract: Secondary | ICD-10-CM | POA: Diagnosis not present

## 2020-04-04 DIAGNOSIS — Z823 Family history of stroke: Secondary | ICD-10-CM | POA: Diagnosis not present

## 2020-04-04 DIAGNOSIS — I499 Cardiac arrhythmia, unspecified: Secondary | ICD-10-CM | POA: Diagnosis not present

## 2020-04-16 ENCOUNTER — Ambulatory Visit (INDEPENDENT_AMBULATORY_CARE_PROVIDER_SITE_OTHER): Payer: Medicare PPO | Admitting: Cardiology

## 2020-04-16 ENCOUNTER — Encounter: Payer: Self-pay | Admitting: Cardiology

## 2020-04-16 ENCOUNTER — Encounter: Payer: Self-pay | Admitting: *Deleted

## 2020-04-16 VITALS — BP 132/78 | HR 98 | Ht 65.0 in | Wt 168.0 lb

## 2020-04-16 DIAGNOSIS — R002 Palpitations: Secondary | ICD-10-CM

## 2020-04-16 DIAGNOSIS — I517 Cardiomegaly: Secondary | ICD-10-CM

## 2020-04-16 DIAGNOSIS — I472 Ventricular tachycardia: Secondary | ICD-10-CM | POA: Diagnosis not present

## 2020-04-16 DIAGNOSIS — I493 Ventricular premature depolarization: Secondary | ICD-10-CM

## 2020-04-16 DIAGNOSIS — I4729 Other ventricular tachycardia: Secondary | ICD-10-CM

## 2020-04-16 NOTE — Patient Instructions (Signed)
Medication Instructions:  Your physician recommends that you continue on your current medications as directed. Please refer to the Current Medication list given to you today.  Testing/Procedures: Your physician has requested that you have an echocardiogram. Echocardiography is a painless test that uses sound waves to create images of your heart. It provides your doctor with information about the size and shape of your heart and how well your heart's chambers and valves are working. This procedure takes approximately one hour. There are no restrictions for this procedure.  This will be done at our [address] location:  [address]   ZIO XT- Long Term Monitor Instructions   Your physician has requested you wear your ZIO patch monitor___14___days.   This is a single patch monitor.  [employer] supplies one patch monitor per enrollment.  Additional stickers are not available.   Please do not apply patch if you will be having a Nuclear Stress Test, Echocardiogram, Cardiac CT, MRI, or Chest Xray during the time frame you would be wearing the monitor. The patch cannot be worn during these tests.  You cannot remove and re-apply the ZIO XT patch monitor.   Your ZIO patch monitor will be sent USPS Priority mail from [employer] directly to your home address. The monitor may also be mailed to a PO BOX if home delivery is not available.   It may take 3-5 days to receive your monitor after you have been enrolled.   Once you have received you monitor, please review enclosed instructions.  Your monitor has already been registered assigning a specific monitor serial # to you.   Applying the monitor   Shave hair from upper left chest.   Hold abrader disc by orange tab.  Rub abrader in 40 strokes over left upper chest as indicated in your monitor instructions.   Clean area with 4 enclosed alcohol pads .  Use all pads to assure are is cleaned thoroughly.  Let dry.   Apply patch  as indicated in monitor instructions.  Patch will be place under collarbone on left side of chest with arrow pointing upward.   Rub patch adhesive wings for 2 minutes.Remove white label marked "1".  Remove white label marked "2".  Rub patch adhesive wings for 2 additional minutes.   While looking in a mirror, press and release button in center of patch.  A small green light will flash 3-4 times .  This will be your only indicator the monitor has been turned on.     Do not shower for the first 24 hours.  You may shower after the first 24 hours.   Press button if you feel a symptom. You will hear a small click.  Record Date, Time and Symptom in the Patient Log Book.   When you are ready to remove patch, follow instructions on last 2 pages of Patient Log Book.  Stick patch monitor onto last page of Patient Log Book.   Place Patient Log Book in Blue box.  Use locking tab on box and tape box closed securely.  The Orange and White box has prepaid postage on it.  Please place in mailbox as soon as possible.  Your physician should have your test results approximately 7 days after the monitor has been mailed back to [employer].   Call [employer] Customer Care at [phone] if you have questions regarding your ZIO XT patch monitor.  Call them immediately if you see an orange light blinking on your   monitor.   If your monitor falls off in less than 4 days contact our Monitor department at [phone].  If your monitor becomes loose or falls off after 4 days call [employer] at [phone] for suggestions on securing your monitor.    Follow-Up: At [facility], you and your health needs are our priority.  As part of our continuing mission to provide you with exceptional heart care, we have created designated Provider Care Teams.  These Care Teams include your primary Cardiologist (physician) and Advanced Practice Providers (APPs -  Physician Assistants and Nurse Practitioners) who all work  together to provide you with the care you need, when you need it.  We recommend signing up for the patient portal called "MyChart".  Sign up information is provided on this After Visit Summary.  MyChart is used to connect with patients for Virtual Visits (Telemedicine).  Patients are able to view lab/test results, encounter notes, upcoming appointments, etc.  Non-urgent messages can be sent to your provider as well.   To learn more about what you can do with MyChart, go to [email or web address].    Your next appointment:   3 month(s)  The format for your next appointment:   In Person  Provider:   Christopher Schumann, MD    

## 2020-04-16 NOTE — Progress Notes (Signed)
Cardiology Office Note:    Date:  04/16/2020   ID:  Karen Nunez, DOB November 16, 1945, MRN [id number]  PCP:  Celene Squibb, MD  Cardiologist:  No primary care provider on file.  Electrophysiologist:  None   Referring MD: Celene Squibb, MD   Chief Complaint  Patient presents with  . Palpitations    History of Present Illness:    Karen Nunez is a 74 y.o. female with no significant past medical history who is referred by Dr. Nevada Crane for evaluation of palpitations.  EKG in clinic with Dr. [place] Crane showed sinus rhythm with multiple PVCs, 5 beat run of NSVT.  She reports she was asymptomatic that day.  Does report she has rare palpitations, less than once per month.  She is active, walks 3-4 times per week for 4 miles each time, denies any chest pain or dyspnea.  Reports occasional lightheadedness with sitting up, denies any syncope.  She drinks 2 cups of coffee per day and usually does not drink other caffeine, but states the day before her appointment with Dr. Nevada Crane she drank a large cup of [facility].  No smoking history.  Family history includes father had CVA in 10s, brother had CVA in 11s, and sister had CVAs in 8s.    Labs 04/01/20 showed Hgb 13.0, plts 219, WBC 6.8, Cr 0.78, K 4.7, LDL 79.  Past Medical History:  Diagnosis Date  . Adenomatous colon polyp 2012  . Cataract   . Osteoporosis     Past Surgical History:  Procedure Laterality Date  . ABDOMINAL HYSTERECTOMY    . Bilateral breast lumpectomies     all benign  . BREAST SURGERY    . CESAREAN SECTION    . COLONOSCOPY  09/25/2011   Procedure: COLONOSCOPY;  Surgeon: Danie Binder, MD;  [facility];  Service: Endoscopy;  Laterality: N/A;  10:30 AM  . COLONOSCOPY N/A 04/19/2017   Procedure: COLONOSCOPY;  Surgeon: Danie Binder, MD;  [facility];  Service: Endoscopy;  Laterality: N/A;  12:45 pm    Current Medications: Current Meds  Medication Sig  . Calcium Carb-Cholecalciferol (CALCIUM 600 + D PO)  Take 600 tablets by mouth 2 (two) times daily.   Marland Kitchen ibuprofen (ADVIL,MOTRIN) 200 MG tablet Take 400 mg daily as needed by mouth for headache or moderate pain.  Marland Kitchen Lysine 1000 MG TABS Take 1,000 mg daily by mouth.     Allergies:   Patient has no known allergies.   Social History   Socioeconomic History  . Marital status: Married    Spouse name: Not on file  . Number of children: Not on file  . Years of education: Not on file  . Highest education level: Not on file  Occupational History  . Occupation: retired  Tobacco Use  . Smoking status: Never Smoker  . Smokeless tobacco: Never Used  Vaping Use  . Vaping Use: Never used  Substance and Sexual Activity  . Alcohol use: Yes    Comment: occasional  . Drug use: No  . Sexual activity: Not on file  Other Topics Concern  . Not on file  Social History Narrative  . Not on file   Social Determinants of Health   Financial Resource Strain:   . Difficulty of Paying Living Expenses: Not on file  Food Insecurity:   . Worried About Charity fundraiser in the Last Year: Not on file  . Ran Out of Food in the Last  Year: Not on file  Transportation Needs:   . Lack of Transportation (Medical): Not on file  . Lack of Transportation (Non-Medical): Not on file  Physical Activity:   . Days of Exercise per Week: Not on file  . Minutes of Exercise per Session: Not on file  Stress:   . Feeling of Stress : Not on file  Social Connections:   . Frequency of Communication with Friends and Family: Not on file  . Frequency of Social Gatherings with Friends and Family: Not on file  . Attends Religious Services: Not on file  . Active Member of Clubs or Organizations: Not on file  . Attends Archivist Meetings: Not on file  . Marital Status: Not on file     Family History: The patient's family history includes Cancer in her mother and sister; Colon cancer (age of onset: 59) in her mother; Diabetes in her brother; Stroke in her father and  sister. There is no history of Colon polyps.  ROS:   Please see the history of present illness.     All other systems reviewed and are negative.  EKGs/Labs/Other Studies Reviewed:    The following studies were reviewed today:   EKG:  EKG is ordered today.  The ekg ordered today demonstrates normal sinus rhythm, rate 98, LVH  Recent Labs: No results found for requested labs within last 8760 hours.  Recent Lipid Panel    Component Value Date/Time   CHOL 137 01/31/2019 0000   TRIG 44 01/31/2019 0000   HDL 60 01/31/2019 0000   LDLCALC 68 01/31/2019 0000    Physical Exam:    VS:  BP 132/78   Pulse 98   Ht 5\' 5"  (1.651 m)   Wt 168 lb (76.2 kg)   SpO2 98%   BMI 27.96 kg/m     Wt Readings from Last 3 Encounters:  04/16/20 168 lb (76.2 kg)  03/09/19 164 lb 9.6 oz (74.7 kg)  04/19/17 168 lb (76.2 kg)     GEN:   in no acute distress HEENT: Normal NECK: No JVD; No carotid bruits CARDIAC: RRR, no murmurs, rubs, gallops RESPIRATORY:  Clear to auscultation without rales, wheezing or rhonchi  ABDOMEN: Soft, non-tender, non-distended MUSCULOSKELETAL:  No edema; No deformity  SKIN: Warm and dry NEUROLOGIC:  Alert and oriented x 3 PSYCHIATRIC:  Normal affect   ASSESSMENT:    1. PVC's (premature ventricular contractions)   2. Palpitations   3. NSVT (nonsustained ventricular tachycardia) ([place])   4. LVH (left ventricular hypertrophy)    PLAN:    Palpitations/PVCs/NSVT: Reports rare palpitations, EKG at Dr. Juel Burrow showed frequent PVCs with 5 beat run of NSVT.  No PVCs on EKG in clinic today.  Will check echocardiogram to rule out structural heart disease.  Zio patch x2 weeks to evaluate for arrhythmia and quantify PVC burden.  LVH: Noted on EKG, no history of hypertension.  Will evaluate further with echocardiogram as above.  RTC in 3 months  Medication Adjustments/Labs and Tests Ordered: Current medicines are reviewed at length with the patient today.  Concerns regarding  medicines are outlined above.  Orders Placed This Encounter  Procedures  . LONG TERM MONITOR (3-14 DAYS)  . EKG 12-Lead  . ECHOCARDIOGRAM COMPLETE   No orders of the defined types were placed in this encounter.   Patient Instructions  Medication Instructions:  Your physician recommends that you continue on your current medications as directed. Please refer to the Current Medication list given to you  today.  Testing/Procedures: Your physician has requested that you have an echocardiogram. Echocardiography is a painless test that uses sound waves to create images of your heart. It provides your doctor with information about the size and shape of your heart and how well your heart's chambers and valves are working. This procedure takes approximately one hour. There are no restrictions for this procedure. This will be done at our [facility] location:  [place] has requested you wear your ZIO patch monitor 14 days.   This is a single patch monitor.  [employer] supplies one patch monitor per enrollment.  Additional stickers are not available.   Please do not apply patch if you will be having a Nuclear Stress Test, Echocardiogram, Cardiac CT, MRI, or Chest Xray during the time frame you would be wearing the monitor. The patch cannot be worn during these tests.  You cannot remove and re-apply the ZIO XT patch monitor.   Your ZIO patch monitor will be sent USPS Priority mail from [facility] directly to your home address. The monitor may also be mailed to a PO BOX if home delivery is not available.   It may take 3-5 days to receive your monitor after you have been enrolled.   Once you have received you monitor, please review enclosed instructions.  Your monitor has already been registered assigning a specific monitor serial # to you.   Applying the monitor   Shave hair from upper left chest.   Hold  abrader disc by orange tab.  Rub abrader in 40 strokes over left upper chest as indicated in your monitor instructions.   Clean area with 4 enclosed alcohol pads .  Use all pads to assure are is cleaned thoroughly.  Let dry.   Apply patch as indicated in monitor instructions.  Patch will be place under collarbone on left side of chest with arrow pointing upward.   Rub patch adhesive wings for 2 minutes.Remove white label marked "1".  Remove white label marked "2".  Rub patch adhesive wings for 2 additional minutes.   While looking in a mirror, press and release button in center of patch.  A small green light will flash 3-4 times .  This will be your only indicator the monitor has been turned on.     Do not shower for the first 24 hours.  You may shower after the first 24 hours.   Press button if you feel a symptom. You will hear a small click.  Record Date, Time and Symptom in the Patient Log Book.   When you are ready to remove patch, follow instructions on last 2 pages of Patient Log Book.  Stick patch monitor onto last page of Patient Log Book.   Place Patient Log Book in [place] box.  Use locking tab on box and tape box closed securely.  The [employer] has [employer] on it.  Please place in mailbox as soon as possible.  Your physician should have your test results approximately 7 days after the monitor has been mailed back to [facility].   Call [place] at [phone] if you have questions regarding your ZIO XT patch monitor.  Call them immediately if you see an orange light blinking on your monitor.   If your monitor falls off in less than 4 days contact our Monitor department at [phone].  If your monitor becomes  loose or falls off after 4 days call [employer] at [phone] for suggestions on securing your monitor.   Follow-Up: At [facility], you and your health needs are our priority.  As part of our continuing mission to provide you with  exceptional heart care, we have created designated Provider Care Teams.  These Care Teams include your primary Cardiologist (physician) and Advanced Practice Providers (APPs -  Physician Assistants and Nurse Practitioners) who all work together to provide you with the care you need, when you need it.  We recommend signing up for the patient portal called "MyChart".  Sign up information is provided on this After Visit Summary.  MyChart is used to connect with patients for Virtual Visits (Telemedicine).  Patients are able to view lab/test results, encounter notes, upcoming appointments, etc.  Non-urgent messages can be sent to your provider as well.   To learn more about what you can do with MyChart, go to [email or web address].    Your next appointment:   3 month(s)  The format for your next appointment:   In Person  Provider:   Oswaldo Milian, MD       Signed, Donato Heinz, MD  04/16/2020 1:14 PM    Jane Lew

## 2020-04-16 NOTE — Progress Notes (Signed)
Patient ID: Karen Nunez, female   DOB: 20-Nov-1945, 74 y.o.   MRN: [id number] Patient enrolled for [employer] to ship a 14 day ZIO XT long term holter monitor to her home.

## 2020-04-18 ENCOUNTER — Other Ambulatory Visit: Payer: Self-pay

## 2020-04-18 ENCOUNTER — Other Ambulatory Visit (INDEPENDENT_AMBULATORY_CARE_PROVIDER_SITE_OTHER): Payer: Medicare PPO

## 2020-04-18 ENCOUNTER — Ambulatory Visit (HOSPITAL_COMMUNITY)
Admission: RE | Admit: 2020-04-18 | Discharge: 2020-04-18 | Disposition: A | Discharge status: Home / Self Care | Payer: Medicare PPO | Source: Ambulatory Visit | Attending: Cardiology | Admitting: Cardiology

## 2020-04-18 DIAGNOSIS — I472 Ventricular tachycardia: Secondary | ICD-10-CM

## 2020-04-18 DIAGNOSIS — R002 Palpitations: Secondary | ICD-10-CM | POA: Diagnosis not present

## 2020-04-18 DIAGNOSIS — I493 Ventricular premature depolarization: Secondary | ICD-10-CM

## 2020-04-18 DIAGNOSIS — I4729 Other ventricular tachycardia: Secondary | ICD-10-CM

## 2020-04-18 LAB — ECHOCARDIOGRAM COMPLETE
Area-P 1/2: 4.36 cm2
S' Lateral: 2.89 cm

## 2020-04-18 NOTE — Progress Notes (Signed)
*  PRELIMINARY RESULTS* Echocardiogram 2D Echocardiogram has been performed.  [name] 04/18/2020, 10:10 AM

## 2020-05-10 DIAGNOSIS — I493 Ventricular premature depolarization: Secondary | ICD-10-CM | POA: Diagnosis not present

## 2020-05-10 DIAGNOSIS — I472 Ventricular tachycardia: Secondary | ICD-10-CM | POA: Diagnosis not present

## 2020-05-10 DIAGNOSIS — R002 Palpitations: Secondary | ICD-10-CM | POA: Diagnosis not present

## 2020-05-20 ENCOUNTER — Other Ambulatory Visit: Payer: Self-pay | Admitting: *Deleted

## 2020-05-20 MED ORDER — METOPROLOL TARTRATE 25 MG PO TABS: 25.0000 mg | ORAL_TABLET | Freq: Two times a day (BID) | ORAL | 3 refills | Status: DC

## 2020-05-20 NOTE — Progress Notes (Signed)
meto

## 2020-06-04 DIAGNOSIS — Z1231 Encounter for screening mammogram for malignant neoplasm of breast: Secondary | ICD-10-CM | POA: Diagnosis not present

## 2020-07-15 DIAGNOSIS — H269 Unspecified cataract: Secondary | ICD-10-CM | POA: Diagnosis not present

## 2020-07-15 DIAGNOSIS — I499 Cardiac arrhythmia, unspecified: Secondary | ICD-10-CM | POA: Diagnosis not present

## 2020-07-15 DIAGNOSIS — Z823 Family history of stroke: Secondary | ICD-10-CM | POA: Diagnosis not present

## 2020-07-15 DIAGNOSIS — Z0000 Encounter for general adult medical examination without abnormal findings: Secondary | ICD-10-CM | POA: Diagnosis not present

## 2020-07-15 DIAGNOSIS — Z0001 Encounter for general adult medical examination with abnormal findings: Secondary | ICD-10-CM | POA: Diagnosis not present

## 2020-07-18 DIAGNOSIS — I499 Cardiac arrhythmia, unspecified: Secondary | ICD-10-CM | POA: Diagnosis not present

## 2020-07-18 DIAGNOSIS — Z823 Family history of stroke: Secondary | ICD-10-CM | POA: Diagnosis not present

## 2020-07-18 DIAGNOSIS — H269 Unspecified cataract: Secondary | ICD-10-CM | POA: Diagnosis not present

## 2020-07-21 NOTE — Progress Notes (Signed)
Cardiology Office Note:    Date:  07/22/2020   ID:  Karen Nunez, DOB 1946-03-21, MRN [id number]  PCP:  Celene Squibb, MD  Cardiologist:  No primary care provider on file.  Electrophysiologist:  None   Referring MD: Celene Squibb, MD   Chief Complaint  Patient presents with  . Palpitations    History of Present Illness:    Karen Nunez is a 75 y.o. female with no significant past medical history who presents for follow-up.  She was referred by Dr. Nevada Crane for evaluation of palpitations, initially seen on 04/16/2020.  EKG in clinic with Dr. [place] Crane showed sinus rhythm with multiple PVCs, 5 beat run of NSVT.  She reports she was asymptomatic that day.  Does report she has rare palpitations, less than once per month.  She is active, walks 3-4 times per week for 4 miles each time, denies any chest pain or dyspnea.  Reports occasional lightheadedness with sitting up, denies any syncope.  She drinks 2 cups of coffee per day and usually does not drink other caffeine, but states the day before her appointment with Dr. Nevada Crane she drank a large cup of [facility].  No smoking history.  Family history includes father had CVA in 19s, brother had CVA in 40s, and sister had CVAs in 67s.    Labs 04/01/20 showed Hgb 13.0, plts 219, WBC 6.8, Cr 0.78, K 4.7, LDL 79.  Echocardiogram on 04/18/2020 showed normal biventricular function, normal diastolic function, mild left atrial dilatation, mild MR.  Zio patch x13 days on 05/10/2020 showed numerous episodes of SVT, longest lasting 18 seconds, numerous episodes of NSVT, longest lasting 10 beats, frequent PVCs (7.9% of beats), occasional PACs (1.6% of beats).  Since last clinic visit, she reports that she is doing well.  Reports palpitations about once per week, which she describes as fluttering feeling lasting for few seconds.  Has been walking 3 days/week for an hour or so.  Denies any chest pain or dyspnea.  Denies any lightheadedness or syncope.  Reports rare alcohol  use.  Drinks 2 cups of coffee per day.  Past Medical History:  Diagnosis Date  . Adenomatous colon polyp 2012  . Cataract   . Osteoporosis     Past Surgical History:  Procedure Laterality Date  . ABDOMINAL HYSTERECTOMY    . Bilateral breast lumpectomies     all benign  . BREAST SURGERY    . CESAREAN SECTION    . COLONOSCOPY  09/25/2011   Procedure: COLONOSCOPY;  Surgeon: Danie Binder, MD;  Location: [facility];  Service: Endoscopy;  Laterality: N/A;  10:30 AM  . COLONOSCOPY N/A 04/19/2017   Procedure: COLONOSCOPY;  Surgeon: Danie Binder, MD;  Location: [facility];  Service: Endoscopy;  Laterality: N/A;  12:45 pm    Current Medications: Current Meds  Medication Sig  . Calcium Carb-Cholecalciferol (CALCIUM 600 + D PO) Take 600 tablets by mouth 2 (two) times daily.   Marland Kitchen ibuprofen (ADVIL,MOTRIN) 200 MG tablet Take 400 mg daily as needed by mouth for headache or moderate pain.  Marland Kitchen Lysine 1000 MG TABS Take 1,000 mg daily by mouth.  . metoprolol succinate (TOPROL-XL) 50 MG 24 hr tablet Take 1 tablet (50 mg total) by mouth daily. Take with or immediately following a meal.  . [DISCONTINUED] metoprolol tartrate (LOPRESSOR) 25 MG tablet Take 1 tablet (25 mg total) by mouth 2 (two) times daily.     Allergies:   Patient has no  known allergies.   Social History   Socioeconomic History  . Marital status: Married    Spouse name: Not on file  . Number of children: Not on file  . Years of education: Not on file  . Highest education level: Not on file  Occupational History  . Occupation: retired  Tobacco Use  . Smoking status: Never Smoker  . Smokeless tobacco: Never Used  Vaping Use  . Vaping Use: Never used  Substance and Sexual Activity  . Alcohol use: Yes    Comment: occasional  . Drug use: No  . Sexual activity: Not on file  Other Topics Concern  . Not on file  Social History Narrative  . Not on file   Social Determinants of Health   Financial Resource Strain:  Not on file  Food Insecurity: Not on file  Transportation Needs: Not on file  Physical Activity: Not on file  Stress: Not on file  Social Connections: Not on file     Family History: The patient's family history includes Cancer in her mother and sister; Colon cancer (age of onset: 51) in her mother; Diabetes in her brother; Stroke in her father and sister. There is no history of Colon polyps.  ROS:   Please see the history of present illness.     All other systems reviewed and are negative.  EKGs/Labs/Other Studies Reviewed:    The following studies were reviewed today:   EKG:  EKG is ordered today.  The ekg ordered today demonstrates normal sinus rhythm, rate 71, no PVCs  Recent Labs: No results found for requested labs within last 8760 hours.  Recent Lipid Panel    Component Value Date/Time   CHOL 137 01/31/2019 0000   TRIG 44 01/31/2019 0000   HDL 60 01/31/2019 0000   LDLCALC 68 01/31/2019 0000    Physical Exam:    VS:  BP 130/77   Pulse 71   Ht 5\' 4"  (1.626 m)   Wt 166 lb (75.3 kg)   SpO2 98%   BMI 28.49 kg/m     Wt Readings from Last 3 Encounters:  07/22/20 166 lb (75.3 kg)  04/16/20 168 lb (76.2 kg)  03/09/19 164 lb 9.6 oz (74.7 kg)     GEN:   in no acute distress HEENT: Normal NECK: No JVD; No carotid bruits CARDIAC: RRR, no murmurs, rubs, gallops RESPIRATORY:  Clear to auscultation without rales, wheezing or rhonchi  ABDOMEN: Soft, non-tender, non-distended MUSCULOSKELETAL:  No edema; No deformity  SKIN: Warm and dry NEUROLOGIC:  Alert and oriented x 3 PSYCHIATRIC:  Normal affect   ASSESSMENT:    1. Frequent PVCs   2. NSVT (nonsustained ventricular tachycardia) ([place])   3. SVT (supraventricular tachycardia) ([facility])    PLAN:     Frequent PVCs/NSVT/SVT: Zio patch x13 days on 05/10/2020 showed numerous episodes of SVT, longest lasting 18 seconds, numerous episodes of NSVT, longest lasting 10 beats, frequent PVCs (7.9% of beats), occasional PACs  (1.6% of beats).  Echocardiogram on 04/18/2020 showed normal biventricular function, normal diastolic function, mild left atrial dilatation, mild MR.   -Started on metoprolol 25 mg twice daily, will consolidate to Toprol-XL 50 mg daily -Check Zio patch x3 days to monitor for improvement since starting metoprolol   RTC in 3 months  Medication Adjustments/Labs and Tests Ordered: Current medicines are reviewed at length with the patient today.  Concerns regarding medicines are outlined above.  Orders Placed This Encounter  Procedures  . LONG TERM MONITOR (3-14 DAYS)  .  EKG 12-Lead   Meds ordered this encounter  Medications  . metoprolol succinate (TOPROL-XL) 50 MG 24 hr tablet    Sig: Take 1 tablet (50 mg total) by mouth daily. Take with or immediately following a meal.    Dispense:  90 tablet    Refill:  3    STOP Lopressor    Patient Instructions  Medication Instructions:  STOP metoprolol tartrate (Lopressor) START metoprolol succinate (Toprol XL) 50 mg once daily  *If you need a refill on your cardiac medications before your next appointment, please call your pharmacy*  Testing/Procedures:  ZIO XT- Long Term Monitor Instructions   Your physician has requested you wear your ZIO patch monitor 3 days.   This is a single patch monitor.  [employer] supplies one patch monitor per enrollment.  Additional stickers are not available.   Please do not apply patch if you will be having a Nuclear Stress Test, Echocardiogram, Cardiac CT, MRI, or Chest Xray during the time frame you would be wearing the monitor. The patch cannot be worn during these tests.  You cannot remove and re-apply the ZIO XT patch monitor.   Your ZIO patch monitor will be sent USPS Priority mail from [facility] directly to your home address. The monitor may also be mailed to a PO BOX if home delivery is not available.   It may take 3-5 days to receive your monitor after you have been enrolled.   Once you have  received you monitor, please review enclosed instructions.  Your monitor has already been registered assigning a specific monitor serial # to you.   Applying the monitor   Shave hair from upper left chest.   Hold abrader disc by orange tab.  Rub abrader in 40 strokes over left upper chest as indicated in your monitor instructions.   Clean area with 4 enclosed alcohol pads .  Use all pads to assure are is cleaned thoroughly.  Let dry.   Apply patch as indicated in monitor instructions.  Patch will be place under collarbone on left side of chest with arrow pointing upward.   Rub patch adhesive wings for 2 minutes.Remove white label marked "1".  Remove white label marked "2".  Rub patch adhesive wings for 2 additional minutes.   While looking in a mirror, press and release button in center of patch.  A small green light will flash 3-4 times .  This will be your only indicator the monitor has been turned on.     Do not shower for the first 24 hours.  You may shower after the first 24 hours.   Press button if you feel a symptom. You will hear a small click.  Record Date, Time and Symptom in the Patient Log Book.   When you are ready to remove patch, follow instructions on last 2 pages of Patient Log Book.  Stick patch monitor onto last page of Patient Log Book.   Place Patient Log Book in [place] box.  Use locking tab on box and tape box closed securely.  The [employer] has [employer] on it.  Please place in mailbox as soon as possible.  Your physician should have your test results approximately 7 days after the monitor has been mailed back to [facility].   Call [place] at [phone] if you have questions regarding your ZIO XT patch monitor.  Call them immediately if you see an orange light blinking on your monitor.   If your  monitor falls off in less than 4 days contact our Monitor department at [phone].  If your monitor becomes loose or falls off  after 4 days call [employer] at [phone] for suggestions on securing your monitor.   Follow-Up: At [facility], you and your health needs are our priority.  As part of our continuing mission to provide you with exceptional heart care, we have created designated Provider Care Teams.  These Care Teams include your primary Cardiologist (physician) and Advanced Practice Providers (APPs -  Physician Assistants and Nurse Practitioners) who all work together to provide you with the care you need, when you need it.  We recommend signing up for the patient portal called "MyChart".  Sign up information is provided on this After Visit Summary.  MyChart is used to connect with patients for Virtual Visits (Telemedicine).  Patients are able to view lab/test results, encounter notes, upcoming appointments, etc.  Non-urgent messages can be sent to your provider as well.   To learn more about what you can do with MyChart, go to [email or web address].    Your next appointment:   3 month(s)  The format for your next appointment:   In Person  Provider:   Oswaldo Milian, MD        Signed, Donato Heinz, MD  07/22/2020 9:27 AM    [place]

## 2020-07-22 ENCOUNTER — Ambulatory Visit: Payer: Medicare PPO | Admitting: Cardiology

## 2020-07-22 ENCOUNTER — Ambulatory Visit (INDEPENDENT_AMBULATORY_CARE_PROVIDER_SITE_OTHER): Payer: Medicare PPO

## 2020-07-22 ENCOUNTER — Encounter: Payer: Self-pay | Admitting: *Deleted

## 2020-07-22 ENCOUNTER — Encounter: Payer: Self-pay | Admitting: Cardiology

## 2020-07-22 ENCOUNTER — Other Ambulatory Visit: Payer: Self-pay

## 2020-07-22 VITALS — BP 130/77 | HR 71 | Ht 64.0 in | Wt 166.0 lb

## 2020-07-22 DIAGNOSIS — I472 Ventricular tachycardia: Secondary | ICD-10-CM

## 2020-07-22 DIAGNOSIS — I493 Ventricular premature depolarization: Secondary | ICD-10-CM

## 2020-07-22 DIAGNOSIS — I471 Supraventricular tachycardia: Secondary | ICD-10-CM

## 2020-07-22 DIAGNOSIS — I4729 Other ventricular tachycardia: Secondary | ICD-10-CM

## 2020-07-22 MED ORDER — METOPROLOL SUCCINATE ER 50 MG PO TB24: 50.0000 mg | ORAL_TABLET | Freq: Every day | ORAL | 3 refills | Status: DC

## 2020-07-22 NOTE — Progress Notes (Signed)
Patient ID: Karen Nunez, female   DOB: 1946-01-21, 75 y.o.   MRN: [id number] Patient enrolled for [employer] to ship a 3 day ZIO XT long term holter monitor to her home.

## 2020-07-22 NOTE — Patient Instructions (Signed)
Medication Instructions:  STOP metoprolol tartrate (Lopressor) START metoprolol succinate (Toprol XL) 50 mg once daily  *If you need a refill on your cardiac medications before your next appointment, please call your pharmacy*  Testing/Procedures:  [place] Monitor Instructions   Your physician has requested you wear your ZIO patch monitor 3 days.   This is a single patch monitor.  [employer] supplies one patch monitor per enrollment.  Additional stickers are not available.   Please do not apply patch if you will be having a Nuclear Stress Test, Echocardiogram, Cardiac CT, MRI, or Chest Xray during the time frame you would be wearing the monitor. The patch cannot be worn during these tests.  You cannot remove and re-apply the ZIO XT patch monitor.   Your ZIO patch monitor will be sent USPS Priority mail from [facility] directly to your home address. The monitor may also be mailed to a PO BOX if home delivery is not available.   It may take 3-5 days to receive your monitor after you have been enrolled.   Once you have received you monitor, please review enclosed instructions.  Your monitor has already been registered assigning a specific monitor serial # to you.   Applying the monitor   Shave hair from upper left chest.   Hold abrader disc by orange tab.  Rub abrader in 40 strokes over left upper chest as indicated in your monitor instructions.   Clean area with 4 enclosed alcohol pads .  Use all pads to assure are is cleaned thoroughly.  Let dry.   Apply patch as indicated in monitor instructions.  Patch will be place under collarbone on left side of chest with arrow pointing upward.   Rub patch adhesive wings for 2 minutes.Remove white label marked "1".  Remove white label marked "2".  Rub patch adhesive wings for 2 additional minutes.   While looking in a mirror, press and release button in center of patch.  A small green light will flash 3-4 times .  This will be  your only indicator the monitor has been turned on.     Do not shower for the first 24 hours.  You may shower after the first 24 hours.   Press button if you feel a symptom. You will hear a small click.  Record Date, Time and Symptom in the Patient Log Book.   When you are ready to remove patch, follow instructions on last 2 pages of Patient Log Book.  Stick patch monitor onto last page of Patient Log Book.   Place Patient Log Book in [place] box.  Use locking tab on box and tape box closed securely.  The [employer] has [employer] on it.  Please place in mailbox as soon as possible.  Your physician should have your test results approximately 7 days after the monitor has been mailed back to [facility].   Call [place] at [phone] if you have questions regarding your ZIO XT patch monitor.  Call them immediately if you see an orange light blinking on your monitor.   If your monitor falls off in less than 4 days contact our Monitor department at [phone].  If your monitor becomes loose or falls off after 4 days call [employer] at [phone] for suggestions on securing your monitor.   Follow-Up: At [facility], you and your health needs are our priority.  As part of our continuing mission to provide you with exceptional heart care,  we have created designated Provider Care Teams.  These Care Teams include your primary Cardiologist (physician) and Advanced Practice Providers (APPs -  Physician Assistants and Nurse Practitioners) who all work together to provide you with the care you need, when you need it.  We recommend signing up for the patient portal called "MyChart".  Sign up information is provided on this After Visit Summary.  MyChart is used to connect with patients for Virtual Visits (Telemedicine).  Patients are able to view lab/test results, encounter notes, upcoming appointments, etc.  Non-urgent messages can be sent to your provider as well.    To learn more about what you can do with MyChart, go to [email or web address].    Your next appointment:   3 month(s)  The format for your next appointment:   In Person  Provider:   Oswaldo Milian, MD

## 2020-07-25 DIAGNOSIS — I493 Ventricular premature depolarization: Secondary | ICD-10-CM | POA: Diagnosis not present

## 2020-08-06 DIAGNOSIS — I493 Ventricular premature depolarization: Secondary | ICD-10-CM | POA: Diagnosis not present

## 2020-10-18 NOTE — Progress Notes (Signed)
Cardiology Office Note:    Date:  10/21/2020   ID:  Karen Nunez, DOB August 01, 1945, MRN [id number]  PCP:  Celene Squibb, MD  Cardiologist:  None  Electrophysiologist:  None   Referring MD: Celene Squibb, MD   Chief Complaint  Patient presents with  . Palpitations    History of Present Illness:    Karen Nunez is a 75 y.o. female with no significant past medical history who presents for follow-up.  She was referred by Dr. Nevada Nunez for evaluation of palpitations, initially seen on 04/16/2020.  EKG in clinic with Dr. [place] Nunez showed sinus rhythm with multiple PVCs, 5 beat run of NSVT.  She reports she was asymptomatic that day.  Does report she has rare palpitations, less than once per month.  She is active, walks 3-4 times per week for 4 miles each time, denies any chest pain or dyspnea.  Reports occasional lightheadedness with sitting up, denies any syncope.  She drinks 2 cups of coffee per day and usually does not drink other caffeine, but states the day before her appointment with Dr. Nevada Nunez she drank a large cup of [facility].  No smoking history.  Family history includes father had CVA in 36s, brother had CVA in 30s, and sister had CVAs in 88s.    Labs 04/01/20 showed Hgb 13.0, plts 219, WBC 6.8, Cr 0.78, K 4.7, LDL 79.  Echocardiogram on 04/18/2020 showed normal biventricular function, normal diastolic function, mild left atrial dilatation, mild MR.  Zio patch x13 days on 05/10/2020 showed 553 episodes of SVT, longest lasting 18 seconds, 146 episodes of NSVT, longest lasting 10 beats, frequent PVCs (7.9% of beats), occasional PACs (1.6% of beats).  Started on metoprolol.  Repeat Zio patch x3 days on 08/06/2020 showed 8 episodes of SVT, longest lasting 9 beats, no NSVT, occasional PVCs (2.7%).  Today, she is accompanied by her husband. Since last clinic visit, she is feeling fine overall. Reports no recent palpitations. Occasionally, she has some lightheadedness that she attributes to seasonal  allergies. Typically she has 1.5 cups of coffee each morning, and drinks water throughout the day. She denies any chest pain, shortness of breath, headaches, or syncope. Also has no lower extremity edema, orthopnea or PND. Of note, she states her brother and sister are having similar cardiac issues.   Past Medical History:  Diagnosis Date  . Adenomatous colon polyp 2012  . Cataract   . Osteoporosis     Past Surgical History:  Procedure Laterality Date  . ABDOMINAL HYSTERECTOMY    . Bilateral breast lumpectomies     all benign  . BREAST SURGERY    . CESAREAN SECTION    . COLONOSCOPY  09/25/2011   Procedure: COLONOSCOPY;  Surgeon: Danie Binder, MD;  Location[facility];  Service: Endoscopy;  Laterality: N/A;  10:30 AM  . COLONOSCOPY N/A 04/19/2017   Procedure: COLONOSCOPY;  Surgeon: Danie Binder, MD;  Location: [facility];  Service: Endoscopy;  Laterality: N/A;  12:45 pm    Current Medications: Current Meds  Medication Sig  . Calcium Carb-Cholecalciferol (CALCIUM 600 + D PO) Take 600 tablets by mouth 2 (two) times daily.   Marland Kitchen ibuprofen (ADVIL,MOTRIN) 200 MG tablet Take 400 mg daily as needed by mouth for headache or moderate pain.  Marland Kitchen Lysine 1000 MG TABS Take 1,000 mg daily by mouth.  . metoprolol succinate (TOPROL-XL) 50 MG 24 hr tablet Take 1 tablet (50 mg total) by mouth daily. Take with  or immediately following a meal.     Allergies:   Patient has no known allergies.   Social History   Socioeconomic History  . Marital status: Married    Spouse name: Not on file  . Number of children: Not on file  . Years of education: Not on file  . Highest education level: Not on file  Occupational History  . Occupation: retired  Tobacco Use  . Smoking status: Never Smoker  . Smokeless tobacco: Never Used  Vaping Use  . Vaping Use: Never used  Substance and Sexual Activity  . Alcohol use: Yes    Comment: occasional  . Drug use: No  . Sexual activity: Not on file   Other Topics Concern  . Not on file  Social History Narrative  . Not on file   Social Determinants of Health   Financial Resource Strain: Not on file  Food Insecurity: Not on file  Transportation Needs: Not on file  Physical Activity: Not on file  Stress: Not on file  Social Connections: Not on file     Family History: The patient's family history includes Cancer in her mother and sister; Colon cancer (age of onset: 55) in her mother; Diabetes in her brother; Stroke in her father and sister. There is no history of Colon polyps.  ROS:   Please see the history of present illness.    (+) Palpitations (+) Lightheadedness All other systems reviewed and are negative.  EKGs/Labs/Other Studies Reviewed:    The following studies were reviewed today:  3 Day Monitor 08/06/2020:  8 runs of SVT, longest lasting 9 beats  Occasional PVCs (2.7%) and supraventricular couplets (1.7%)    Patch Wear Time:  3 days and 0 hours (2022-02-17T08:16:25-0500 to 2022-02-20T08:19:51-0500)  Patient had a min HR of 51 bpm, max HR of 176 bpm, and avg HR of 77 bpm. Predominant underlying rhythm was Sinus Rhythm. 8 Supraventricular Tachycardia runs occurred, the run with the fastest interval lasting 5 beats with a max rate of 176 bpm, the  longest lasting 9 beats with an avg rate of 129 bpm. Supraventricular Tachycardia was detected within +/- 45 seconds of symptomatic patient event(s). Isolated SVEs were rare (<1.0%, 1880), SVE Couplets were occasional (1.7%, 2786), and SVE Triplets were  rare (<1.0%, 83). Isolated VEs were occasional (2.7%, 9263), VE Couplets were rare (<1.0%, 442), and no VE Triplets were present. Ventricular Bigeminy and Trigeminy were present.  14 patient triggered events, corresponding to sinus rhythm  PACs/PVCs and SVT  Echo 04/18/2020: 1. Left ventricular ejection fraction, by estimation, is approximately  55%. The left ventricle has normal function. The left ventricle has no   regional wall motion abnormalities. Left ventricular diastolic parameters  were normal.  2. Right ventricular systolic function is normal. The right ventricular  size is normal. Tricuspid regurgitation signal is inadequate for assessing  PA pressure.  3. Left atrial size was mildly dilated.  4. The mitral valve is grossly normal. Mild mitral valve regurgitation.  5. The aortic valve is tricuspid. Aortic valve regurgitation is not  visualized.  6. The inferior vena cava is normal in size with greater than 50%  respiratory variability, suggesting right atrial pressure of 3 mmHg.  EKG:   10/21/2020: normal sinus rhythm, rate 61, no ST/T abnormalities 07/22/2020: normal sinus rhythm, rate 71, no PVCs 04/16/2020: normal sinus rhythm, rate 98, LVH  Recent Labs: No results found for requested labs within last 8760 hours.  Recent Lipid Panel    Component Value  Date/Time   CHOL 137 01/31/2019 0000   TRIG 44 01/31/2019 0000   HDL 60 01/31/2019 0000   LDLCALC 68 01/31/2019 0000    Physical Exam:    VS:  BP (!) 146/82   Pulse 61   Ht 5' 4.5" (1.638 m)   Wt 167 lb 3.2 oz (75.8 kg)   SpO2 100%   BMI 28.26 kg/m     Wt Readings from Last 3 Encounters:  10/21/20 167 lb 3.2 oz (75.8 kg)  07/22/20 166 lb (75.3 kg)  04/16/20 168 lb (76.2 kg)     GEN:   in no acute distress HEENT: Normal NECK: No JVD; No carotid bruits CARDIAC: RRR, no murmurs, rubs, gallops RESPIRATORY:  Clear to auscultation without rales, wheezing or rhonchi  ABDOMEN: Soft, non-tender, non-distended MUSCULOSKELETAL:  No edema; No deformity  SKIN: Warm and dry NEUROLOGIC:  Alert and oriented x 3 PSYCHIATRIC:  Normal affect   ASSESSMENT:    1. SVT (supraventricular tachycardia) ([place])   2. PVC's (premature ventricular contractions)   3. NSVT (nonsustained ventricular tachycardia) ([facility])    PLAN:     Frequent PVCs/NSVT/SVT: Zio patch x13 days on 05/10/2020 showed numerous episodes of SVT, longest lasting  18 seconds, numerous episodes of NSVT, longest lasting 10 beats, frequent PVCs (7.9% of beats), occasional PACs (1.6% of beats).  Echocardiogram on 04/18/2020 showed normal biventricular function, normal diastolic function, mild left atrial dilatation, mild MR.  Repeat Zio patch x3 days on 08/06/2020 showed 8 episodes of SVT, longest lasting 9 beats, no NSVT, occasional PVCs (2.7%). -Significant improvement in PVC/SVT/NSVT since starting metoprolol and reports palpitations have resolved.  Continue Toprol-XL 50 mg daily   RTC in 1 year.  Medication Adjustments/Labs and Tests Ordered: Current medicines are reviewed at length with the patient today.  Concerns regarding medicines are outlined above.  Orders Placed This Encounter  Procedures  . EKG 12-Lead   No orders of the defined types were placed in this encounter.   Patient Instructions  Medication Instructions:  Your physician recommends that you continue on your current medications as directed. Please refer to the Current Medication list given to you today.  *If you need a refill on your cardiac medications before your next appointment, please call your pharmacy*  Follow-Up: At [facility], you and your health needs are our priority.  As part of our continuing mission to provide you with exceptional heart care, we have created designated Provider Care Teams.  These Care Teams include your primary Cardiologist (physician) and Advanced Practice Providers (APPs -  Physician Assistants and Nurse Practitioners) who all work together to provide you with the care you need, when you need it.  We recommend signing up for the patient portal called "MyChart".  Sign up information is provided on this After Visit Summary.  MyChart is used to connect with patients for Virtual Visits (Telemedicine).  Patients are able to view lab/test results, encounter notes, upcoming appointments, etc.  Non-urgent messages can be sent to your provider as well.   To  learn more about what you can do with MyChart, go to [email or web address].    Your next appointment:   12 month(s)  The format for your next appointment:   In Person  Provider:   Oswaldo Milian, MD       [facility],acting as a scribe for Donato Heinz, MD.,have documented all relevant documentation on the behalf of Donato Heinz, MD,as directed by  Donato Heinz, MD while in the presence  of Donato Heinz, MD.  I, Donato Heinz, MD, have reviewed all documentation for this visit. The documentation on 10/21/20 for the exam, diagnosis, procedures, and orders are all accurate and complete.   Signed, Donato Heinz, MD  10/21/2020 9:41 AM    [facility]

## 2020-10-21 ENCOUNTER — Encounter: Payer: Self-pay | Admitting: Cardiology

## 2020-10-21 ENCOUNTER — Other Ambulatory Visit: Payer: Self-pay

## 2020-10-21 ENCOUNTER — Ambulatory Visit: Payer: Medicare PPO | Admitting: Cardiology

## 2020-10-21 VITALS — BP 146/82 | HR 61 | Ht 64.5 in | Wt 167.2 lb

## 2020-10-21 DIAGNOSIS — I472 Ventricular tachycardia: Secondary | ICD-10-CM | POA: Diagnosis not present

## 2020-10-21 DIAGNOSIS — I471 Supraventricular tachycardia: Secondary | ICD-10-CM

## 2020-10-21 DIAGNOSIS — I493 Ventricular premature depolarization: Secondary | ICD-10-CM | POA: Diagnosis not present

## 2020-10-21 DIAGNOSIS — I4729 Other ventricular tachycardia: Secondary | ICD-10-CM

## 2020-10-21 NOTE — Patient Instructions (Signed)

## 2021-01-20 DIAGNOSIS — I10 Essential (primary) hypertension: Secondary | ICD-10-CM | POA: Diagnosis not present

## 2021-01-24 DIAGNOSIS — Z0001 Encounter for general adult medical examination with abnormal findings: Secondary | ICD-10-CM | POA: Diagnosis not present

## 2021-01-24 DIAGNOSIS — H269 Unspecified cataract: Secondary | ICD-10-CM | POA: Diagnosis not present

## 2021-01-24 DIAGNOSIS — I471 Supraventricular tachycardia: Secondary | ICD-10-CM | POA: Diagnosis not present

## 2021-01-24 DIAGNOSIS — Z0000 Encounter for general adult medical examination without abnormal findings: Secondary | ICD-10-CM | POA: Diagnosis not present

## 2021-01-24 DIAGNOSIS — Z823 Family history of stroke: Secondary | ICD-10-CM | POA: Diagnosis not present

## 2021-03-03 DIAGNOSIS — H25813 Combined forms of age-related cataract, bilateral: Secondary | ICD-10-CM | POA: Diagnosis not present

## 2021-04-21 ENCOUNTER — Other Ambulatory Visit: Payer: Self-pay | Admitting: Cardiology

## 2021-05-15 DIAGNOSIS — H01002 Unspecified blepharitis right lower eyelid: Secondary | ICD-10-CM | POA: Diagnosis not present

## 2021-05-15 DIAGNOSIS — H01001 Unspecified blepharitis right upper eyelid: Secondary | ICD-10-CM | POA: Diagnosis not present

## 2021-05-15 DIAGNOSIS — H01004 Unspecified blepharitis left upper eyelid: Secondary | ICD-10-CM | POA: Diagnosis not present

## 2021-05-15 DIAGNOSIS — H25813 Combined forms of age-related cataract, bilateral: Secondary | ICD-10-CM | POA: Diagnosis not present

## 2021-06-19 DIAGNOSIS — H25811 Combined forms of age-related cataract, right eye: Secondary | ICD-10-CM | POA: Diagnosis not present

## 2021-06-20 ENCOUNTER — Encounter (HOSPITAL_COMMUNITY)
Admission: RE | Admit: 2021-06-20 | Discharge: 2021-06-20 | Disposition: A | Discharge status: Home / Self Care | Payer: Medicare PPO | Source: Ambulatory Visit | Attending: Ophthalmology | Admitting: Ophthalmology

## 2021-06-20 HISTORY — DX: Cardiac arrhythmia, unspecified: I49.9

## 2021-06-23 ENCOUNTER — Encounter (HOSPITAL_COMMUNITY): Payer: Self-pay

## 2021-06-23 NOTE — H&P (Signed)
Surgical History & Physical  Patient Name: Karen Nunez DOB: 01/07/1946  Surgery: Cataract extraction with intraocular lens implant phacoemulsification; Right Eye  Surgeon: Baruch Goldmann MD Surgery Date:  06-27-21 Pre-Op Date:  06-19-21  HPI: A 48 Yr. old female patient is referred by Dr Jorja Loa for cataract eval. 1. The patient complains of difficulty when seeing street signs, which began more than 6 months ago. Both eyes are affected. The episode is gradual. The condition's severity increased since last visit. Symptoms occur when the patient is inside and outside. This is negatively affecting the patient's quality of life and the patient is unable to function adequately in life with the current level of vision. HPI Completed by Dr. Baruch Goldmann  Medical History: Cataracts Heart Problem Rapid heart beat  Review of Systems Cardiovascular Rapid Heart beat All recorded systems are negative except as noted above.  Social   Never smoked   Medication Metoprolol ER, Zyrtec, Multivitamin, Calcium Supplement, Aspirin,   Sx/Procedures Breast Biopsy Neg,   Drug Allergies   NKDA  History & Physical: Heent: Cataract, Right Eye NECK: supple without bruits LUNGS: lungs clear to auscultation CV: regular rate and rhythm Abdomen: soft and non-tender  Impression & Plan: Assessment: 1.  COMBINED FORMS AGE RELATED CATARACT; Both Eyes (H25.813) 2.  BLEPHARITIS; Right Upper Lid, Right Lower Lid, Left Upper Lid, Left Lower Lid (H01.001, H01.002,H01.004,H01.005) 3.  DERMATOCHALASIS, no surgery; Right Upper Lid, Left Upper Lid (H02.831, H02.834) 4.  Pinguecula; Both Eyes (H11.153) 5.  VITREOUS DETACHMENT PVD; Left Eye (H43.812) 6.  ASTIGMATISM, REGULAR; Both Eyes (H52.223)  Plan: 1.  Cataract accounts for the patient's decreased vision. This visual impairment is not correctable with a tolerable change in glasses or contact lenses. Cataract surgery with an implantation of a new lens should  significantly improve the visual and functional status of the patient. Discussed all risks, benefits, alternatives, and potential complications. Discussed the procedures and recovery. Patient desires to have surgery. A-scan ordered and performed today for intra-ocular lens calculations. The surgery will be performed in order to improve vision for driving, reading, and for eye examinations. Recommend phacoemulsification with intra-ocular lens. Recommend Dextenza for post-operative pain and inflammation. Right Eye. worse - first. Dilates well - shugarcaine by protocol. Consider Synergy Lens (possible Synergy Toric).  2.  Recommend regular lid cleaning.  3.  Asymptomatic, recommend observation for now. Findings, prognosis and treatment options reviewed.  4.  Observe; Artificial tears as needed for irritation.  5.  Old Asymptomatic. RD precautions given. Patient to call with increase in flashing lights/floaters/dark curtain.  6.  May require toric lens - will run calculator.

## 2021-06-24 DIAGNOSIS — Z1231 Encounter for screening mammogram for malignant neoplasm of breast: Secondary | ICD-10-CM | POA: Diagnosis not present

## 2021-06-27 ENCOUNTER — Encounter (HOSPITAL_COMMUNITY)
Admission: RE | Disposition: A | Discharge status: Home / Self Care | Payer: Self-pay | Source: Home / Self Care | Attending: Ophthalmology

## 2021-06-27 ENCOUNTER — Ambulatory Visit (HOSPITAL_COMMUNITY)
Admission: RE | Admit: 2021-06-27 | Discharge: 2021-06-27 | Disposition: A | Discharge status: Home / Self Care | Payer: Medicare PPO | Attending: Ophthalmology | Admitting: Ophthalmology

## 2021-06-27 ENCOUNTER — Ambulatory Visit (HOSPITAL_COMMUNITY): Payer: Medicare PPO | Admitting: Anesthesiology

## 2021-06-27 ENCOUNTER — Encounter (HOSPITAL_COMMUNITY): Payer: Self-pay | Admitting: Ophthalmology

## 2021-06-27 DIAGNOSIS — H11153 Pinguecula, bilateral: Secondary | ICD-10-CM | POA: Diagnosis not present

## 2021-06-27 DIAGNOSIS — H02831 Dermatochalasis of right upper eyelid: Secondary | ICD-10-CM | POA: Diagnosis not present

## 2021-06-27 DIAGNOSIS — I499 Cardiac arrhythmia, unspecified: Secondary | ICD-10-CM | POA: Diagnosis not present

## 2021-06-27 DIAGNOSIS — H2511 Age-related nuclear cataract, right eye: Secondary | ICD-10-CM | POA: Diagnosis not present

## 2021-06-27 DIAGNOSIS — H52223 Regular astigmatism, bilateral: Secondary | ICD-10-CM | POA: Insufficient documentation

## 2021-06-27 DIAGNOSIS — H25811 Combined forms of age-related cataract, right eye: Secondary | ICD-10-CM | POA: Insufficient documentation

## 2021-06-27 DIAGNOSIS — H02834 Dermatochalasis of left upper eyelid: Secondary | ICD-10-CM | POA: Insufficient documentation

## 2021-06-27 DIAGNOSIS — H43812 Vitreous degeneration, left eye: Secondary | ICD-10-CM | POA: Diagnosis not present

## 2021-06-27 DIAGNOSIS — H0100B Unspecified blepharitis left eye, upper and lower eyelids: Secondary | ICD-10-CM | POA: Insufficient documentation

## 2021-06-27 DIAGNOSIS — H5711 Ocular pain, right eye: Secondary | ICD-10-CM | POA: Diagnosis not present

## 2021-06-27 DIAGNOSIS — H0100A Unspecified blepharitis right eye, upper and lower eyelids: Secondary | ICD-10-CM | POA: Diagnosis not present

## 2021-06-27 SURGERY — CATARACT EXTRACTION PHACO AND INTRAOCULAR LENS PLACEMENT (IOC) with placement of Corticosteroid
Anesthesia: Monitor Anesthesia Care | Site: Eye | Laterality: Right

## 2021-06-27 MED ORDER — EPINEPHRINE PF 1 MG/ML IJ SOLN
INTRAOCULAR | Status: DC | PRN
  Administered 2021-06-27: 500 mL

## 2021-06-27 MED ORDER — POVIDONE-IODINE 5 % OP SOLN
OPHTHALMIC | Status: DC | PRN
  Administered 2021-06-27: 1 via OPHTHALMIC

## 2021-06-27 MED ORDER — ORAL CARE MOUTH RINSE: 15.0000 mL | Freq: Once | OROMUCOSAL | Status: DC

## 2021-06-27 MED ORDER — MIDAZOLAM HCL 2 MG/2ML IJ SOLN
INTRAMUSCULAR | Status: DC | PRN
  Administered 2021-06-27 (×2): 1 mg via INTRAVENOUS

## 2021-06-27 MED ORDER — TROPICAMIDE 1 % OP SOLN
1.0000 [drp] | OPHTHALMIC | Status: AC | PRN
  Administered 2021-06-27 (×3): 1 [drp] via OPHTHALMIC
  Filled 2021-06-27: qty 2

## 2021-06-27 MED ORDER — LIDOCAINE HCL (PF) 1 % IJ SOLN
INTRAOCULAR | Status: DC | PRN
  Administered 2021-06-27: 1 mL via OPHTHALMIC

## 2021-06-27 MED ORDER — LIDOCAINE HCL 3.5 % OP GEL
1.0000 "application " | Freq: Once | OPHTHALMIC | Status: AC
  Administered 2021-06-27: 1 via OPHTHALMIC

## 2021-06-27 MED ORDER — DEXAMETHASONE 0.4 MG OP INST
VAGINAL_INSERT | OPHTHALMIC | Status: AC
  Filled 2021-06-27: qty 1

## 2021-06-27 MED ORDER — SODIUM CHLORIDE 0.9% FLUSH
INTRAVENOUS | Status: DC | PRN
  Administered 2021-06-27 (×2): 3 mL via INTRAVENOUS

## 2021-06-27 MED ORDER — MIDAZOLAM HCL 2 MG/2ML IJ SOLN
INTRAMUSCULAR | Status: AC
  Filled 2021-06-27: qty 2

## 2021-06-27 MED ORDER — SODIUM HYALURONATE 23MG/ML IO SOSY
PREFILLED_SYRINGE | INTRAOCULAR | Status: DC | PRN
  Administered 2021-06-27: 0.6 mL via INTRAOCULAR

## 2021-06-27 MED ORDER — SODIUM HYALURONATE 10 MG/ML IO SOLUTION
PREFILLED_SYRINGE | INTRAOCULAR | Status: DC | PRN
  Administered 2021-06-27: 0.85 mL via INTRAOCULAR

## 2021-06-27 MED ORDER — DEXAMETHASONE 0.4 MG OP INST
VAGINAL_INSERT | OPHTHALMIC | Status: DC | PRN
  Administered 2021-06-27: 0.4 mg via OPHTHALMIC

## 2021-06-27 MED ORDER — TETRACAINE HCL 0.5 % OP SOLN
1.0000 [drp] | OPHTHALMIC | Status: AC | PRN
  Administered 2021-06-27 (×3): 1 [drp] via OPHTHALMIC

## 2021-06-27 MED ORDER — STERILE WATER FOR IRRIGATION IR SOLN
Status: DC | PRN
  Administered 2021-06-27: 250 mL

## 2021-06-27 MED ORDER — PHENYLEPHRINE HCL 2.5 % OP SOLN
1.0000 [drp] | OPHTHALMIC | Status: DC | PRN
  Administered 2021-06-27 (×2): 1 [drp] via OPHTHALMIC

## 2021-06-27 MED ORDER — LACTATED RINGERS IV SOLN: INTRAVENOUS | Status: DC

## 2021-06-27 MED ORDER — CHLORHEXIDINE GLUCONATE 0.12 % MT SOLN: 15.0000 mL | Freq: Once | OROMUCOSAL | Status: DC

## 2021-06-27 MED ORDER — BSS IO SOLN
INTRAOCULAR | Status: DC | PRN
  Administered 2021-06-27: 15 mL via INTRAOCULAR

## 2021-06-27 SURGICAL SUPPLY — 14 items
CATARACT SUITE SIGHTPATH (MISCELLANEOUS) ×2 IMPLANT
CLOTH BEACON ORANGE TIMEOUT ST (SAFETY) ×1 IMPLANT
EYE SHIELD UNIVERSAL CLEAR (GAUZE/BANDAGES/DRESSINGS) ×1 IMPLANT
FEE CATARACT SUITE SIGHTPATH (MISCELLANEOUS) IMPLANT
GLOVE SURG UNDER POLY LF SZ7 (GLOVE) ×2 IMPLANT
LENS IOL RAYNER 17.0 (Intraocular Lens) ×2 IMPLANT
LENS IOL RAYONE EMV 17.0 (Intraocular Lens) IMPLANT
NDL HYPO 18GX1.5 BLUNT FILL (NEEDLE) IMPLANT
NEEDLE HYPO 18GX1.5 BLUNT FILL (NEEDLE) ×2 IMPLANT
PAD ARMBOARD 7.5X6 YLW CONV (MISCELLANEOUS) ×1 IMPLANT
SYR TB 1ML LL NO SAFETY (SYRINGE) ×1 IMPLANT
TAPE SURG TRANSPORE 1 IN (GAUZE/BANDAGES/DRESSINGS) IMPLANT
TAPE SURGICAL TRANSPORE 1 IN (GAUZE/BANDAGES/DRESSINGS) ×2
WATER STERILE IRR 250ML POUR (IV SOLUTION) ×1 IMPLANT

## 2021-06-27 NOTE — Transfer of Care (Signed)
Immediate Anesthesia Transfer of Care Note  Patient: Karen Nunez  Procedure(s) Performed: CATARACT EXTRACTION PHACO AND INTRAOCULAR LENS PLACEMENT (IOC) with placement of Corticosteroid (Right: Eye)  Patient Location: Short Stay  Anesthesia Type:MAC  Level of Consciousness: awake and patient cooperative  Airway & Oxygen Therapy: Patient Spontanous Breathing  Post-op Assessment: Report given to RN and Post -op Vital signs reviewed and stable  Post vital signs: Reviewed and stable  Last Vitals:  Vitals Value Taken Time  BP    Temp    Pulse    Resp    SpO2      Last Pain:  Vitals:   06/27/21 0708  TempSrc: Oral  PainSc: 0-No pain      Patients Stated Pain Goal: 7 (07/68/08 8110)  Complications: No notable events documented.

## 2021-06-27 NOTE — Anesthesia Preprocedure Evaluation (Signed)
Anesthesia Evaluation  Patient identified by MRN, date of birth, ID band Patient awake    Reviewed: Allergy & Precautions, NPO status , Patient's Chart, lab work & pertinent test results  Airway Mallampati: II       Dental no notable dental hx.    Pulmonary neg pulmonary ROS,    Pulmonary exam normal        Cardiovascular + dysrhythmias  Rhythm:Irregular     Neuro/Psych negative neurological ROS  negative psych ROS   GI/Hepatic negative GI ROS, Neg liver ROS,   Endo/Other  negative endocrine ROS  Renal/GU negative Renal ROS     Musculoskeletal negative musculoskeletal ROS (+)   Abdominal Normal abdominal exam  (+)   Peds  Hematology negative hematology ROS (+)   Anesthesia Other Findings   Reproductive/Obstetrics                             Anesthesia Physical Anesthesia Plan  ASA: 2  Anesthesia Plan: MAC   Post-op Pain Management:    Induction: Intravenous  PONV Risk Score and Plan: 2  Airway Management Planned: Nasal Cannula and Natural Airway  Additional Equipment:   Intra-op Plan:   Post-operative Plan:   Informed Consent: I have reviewed the patients History and Physical, chart, labs and discussed the procedure including the risks, benefits and alternatives for the proposed anesthesia with the patient or authorized representative who has indicated his/her understanding and acceptance.       Plan Discussed with: CRNA  Anesthesia Plan Comments:         Anesthesia Quick Evaluation

## 2021-06-27 NOTE — Anesthesia Procedure Notes (Signed)
Date/Time: 06/27/2021 7:58 AM Performed by: Vista Deck, CRNA Pre-anesthesia Checklist: Patient identified, Emergency Drugs available, Suction available, Timeout performed and Patient being monitored Patient Re-evaluated:Patient Re-evaluated prior to induction Oxygen Delivery Method: Non-rebreather mask

## 2021-06-27 NOTE — Discharge Instructions (Signed)
Please discharge patient when stable, will follow up today with Dr. Fahima Cifelli at the [facility] office immediately following discharge.  Leave shield in place until visit.  All paperwork with discharge instructions will be given at the office.  [facility] Address:  [address]  [place], [place] [address]  

## 2021-06-27 NOTE — Interval H&P Note (Signed)
History and Physical Interval Note:  06/27/2021 7:49 AM  Karen Nunez  has presented today for surgery, with the diagnosis of combined forms age related cataract, right eye.  The various methods of treatment have been discussed with the patient and family. After consideration of risks, benefits and other options for treatment, the patient has consented to  Procedure(s): CATARACT EXTRACTION PHACO AND INTRAOCULAR LENS PLACEMENT ([place]) with placement of Corticosteroid (Right) as a surgical intervention.  The patient's history has been reviewed, patient examined, no change in status, stable for surgery.  I have reviewed the patient's chart and labs.  Questions were answered to the patient's satisfaction.     Baruch Goldmann

## 2021-06-27 NOTE — Anesthesia Postprocedure Evaluation (Signed)
Anesthesia Post Note  Patient: Karen Nunez  Procedure(s) Performed: CATARACT EXTRACTION PHACO AND INTRAOCULAR LENS PLACEMENT (IOC) with placement of Corticosteroid (Right: Eye)  Patient location during evaluation: PACU Anesthesia Type: MAC Level of consciousness: awake and alert Pain management: pain level controlled Vital Signs Assessment: post-procedure vital signs reviewed and stable Respiratory status: spontaneous breathing, nonlabored ventilation, respiratory function stable and patient connected to nasal cannula oxygen Cardiovascular status: stable and blood pressure returned to baseline Postop Assessment: no apparent nausea or vomiting Anesthetic complications: no   No notable events documented.   Last Vitals:  Vitals:   06/27/21 0708 06/27/21 0820  BP: (!) 150/87 132/74  Pulse: 71 71  Resp: 16 18  Temp: 36.8 C 36.6 C  SpO2: 98% 98%    Last Pain:  Vitals:   06/27/21 0820  TempSrc: Oral  PainSc: 0-No pain                 Trixie Rude

## 2021-06-27 NOTE — Op Note (Signed)
Date of procedure: 06/27/21  Pre-operative diagnosis:  Visually significant combined form age-related cataract, Right Eye (H25.811)  Post-operative diagnosis:   1. Visually significant combined form age-related cataract, Right Eye (H25.811) 2. Pain and inflammation following cataract surgery Right Eye (H57.11)  Procedure:  Removal of cataract via phacoemulsification and insertion of intra-ocular lens Rayner [id number] +17.0D into the capsular bag of the Right Eye 2. Placement of Dextenza insert, Right Eye  Attending surgeon: Gerda Diss. Lashica Hannay, MD, MA  Anesthesia: MAC, Topical Ak[place]  Complications: None  Estimated Blood Loss: <49m (minimal)  Specimens: None  Implants: As above  Indications:  Visually significant age-related cataract, Right Eye  Procedure:  The patient was seen and identified in the pre-operative area. The operative eye was identified and dilated.  The operative eye was marked.  Topical anesthesia was administered to the operative eye.     The patient was then to the operative suite and placed in the supine position.  A timeout was performed confirming the patient, procedure to be performed, and all other relevant information.   The patient's face was prepped and draped in the usual fashion for intra-ocular surgery.  A lid speculum was placed into the operative eye and the surgical microscope moved into place and focused.  A superotemporal paracentesis was created using a 20 gauge paracentesis blade.  Shugarcaine was injected into the anterior chamber.  Viscoelastic was injected into the anterior chamber.  A temporal clear-corneal main wound incision was created using a 2.496mmicrokeratome.  A continuous curvilinear capsulorrhexis was initiated using an irrigating cystitome and completed using capsulorrhexis forceps.  Hydrodissection and hydrodeliniation were performed.  Viscoelastic was injected into the anterior chamber.  A phacoemulsification handpiece and a chopper as a  second instrument were used to remove the nucleus and epinucleus. The irrigation/aspiration handpiece was used to remove any remaining cortical material.   The capsular bag was reinflated with viscoelastic, checked, and found to be intact.  The intraocular lens was inserted into the capsular bag.  The irrigation/aspiration handpiece was used to remove any remaining viscoelastic.  The clear corneal wound and paracentesis wounds were then hydrated and checked with Weck-Cels to be watertight.    The lid-speculum was removed. The lower punctum was dilated. A Dextenza implant was placed in the lower canaliculus without complication.  The drape was removed.  The patient's face was cleaned with a wet and dry 4x4. A clear shield was taped over the eye. The patient was taken to the post-operative care unit in good condition, having tolerated the procedure well.  Post-Op Instructions: [facility] and will receive all other orders and instructions.

## 2021-07-03 DIAGNOSIS — H25812 Combined forms of age-related cataract, left eye: Secondary | ICD-10-CM | POA: Diagnosis not present

## 2021-07-04 DIAGNOSIS — R922 Inconclusive mammogram: Secondary | ICD-10-CM | POA: Diagnosis not present

## 2021-07-04 DIAGNOSIS — N6489 Other specified disorders of breast: Secondary | ICD-10-CM | POA: Diagnosis not present

## 2021-07-04 DIAGNOSIS — N6001 Solitary cyst of right breast: Secondary | ICD-10-CM | POA: Diagnosis not present

## 2021-07-04 NOTE — H&P (Signed)
Surgical History & Physical  Patient Name: Karen Nunez DOB: Apr 17, 1946  Surgery: Cataract extraction with intraocular lens implant phacoemulsification; Left Eye  Surgeon: Baruch Goldmann MD Surgery Date:  07-11-21 Pre-Op Date:  07-03-21  HPI: A 11 Yr. old female patient 1. The patient is returning after cataract post-op. The right eye is affected. Status post cataract post-op, which began 1 weeks ago: Onset was S/P CE IOL OD with Dextenza. Since the last visit, the affected area is doing well. The patient's vision is improved. Patient is following medication instructions, the patient is using the combination post op drop TID OD. The patient experiences no eye pain and no flashes, floater, shadow, curtain or veil. The patient did have a headache for the first couple of days, but it went away. The patient also presents for pre op OS, and complaints of blurry/ hazy vision OS. This is negatively affecting the patient's quality of life and the patient is unable to function adequately in life with the current level of vision.. The patient states vision OS still has a yellowish tint which makes it more difficult to read fine print on labels. HPI was performed by Baruch Goldmann .  Medical History: Cataracts Heart Problem Rapid heart beat  Review of Systems Cardiovascular Rapid Heart beat All recorded systems are negative except as noted above.  Social   Never smoked   Medication Prednisolone-Moxifloxacin-Bromfenac, Metoprolol ER, Zyrtec, Multivitamin, Calcium Supplement, Aspirin,   Sx/Procedures Phaco c IOL OD,  Breast Biopsy Neg,   Drug Allergies   NKDA  History & Physical: Heent:  Cataract, Left Eye NECK: supple without bruits LUNGS: lungs clear to auscultation CV: regular rate and rhythm Abdomen: soft and non-tender  Impression & Plan: Assessment: 1.  CATARACT EXTRACTION STATUS; Right Eye (Z98.41) 2.  COMBINED FORMS AGE RELATED CATARACT; Left Eye (H25.812) 3.  Hyperopia ; Right  Eye (H52.01)  Plan: 1.  1 week after cataract surgery. Doing well with improved vision and normal eye pressure. Call with any problems or concerns. Continue Pred-Moxi-Brom 2x/day for 3 more weeks.  2.  Cataract accounts for the patient's decreased vision. This visual impairment is not correctable with a tolerable change in glasses or contact lenses. Cataract surgery with an implantation of a new lens should significantly improve the visual and functional status of the patient. Discussed all risks, benefits, alternatives, and potential complications. Discussed the procedures and recovery. Patient desires to have surgery. A-scan ordered and performed today for intra-ocular lens calculations. The surgery will be performed in order to improve vision for driving, reading, and for eye examinations. Recommend phacoemulsification with intra-ocular lens. Recommend Dextenza for post-operative pain and inflammation. Left Eye. Surgery required to correct imbalance of vision. Dilates well - shugarcaine by protocol.  3.

## 2021-07-07 ENCOUNTER — Encounter (HOSPITAL_COMMUNITY)
Admission: RE | Admit: 2021-07-07 | Discharge: 2021-07-07 | Disposition: A | Discharge status: Home / Self Care | Payer: Medicare PPO | Source: Ambulatory Visit | Attending: Ophthalmology | Admitting: Ophthalmology

## 2021-07-11 ENCOUNTER — Ambulatory Visit (HOSPITAL_COMMUNITY): Payer: Medicare PPO | Admitting: Anesthesiology

## 2021-07-11 ENCOUNTER — Ambulatory Visit (HOSPITAL_COMMUNITY)
Admission: RE | Admit: 2021-07-11 | Discharge: 2021-07-11 | Disposition: A | Discharge status: Home / Self Care | Payer: Medicare PPO | Attending: Ophthalmology | Admitting: Ophthalmology

## 2021-07-11 ENCOUNTER — Encounter (HOSPITAL_COMMUNITY): Payer: Self-pay | Admitting: Ophthalmology

## 2021-07-11 ENCOUNTER — Encounter (HOSPITAL_COMMUNITY)
Admission: RE | Disposition: A | Discharge status: Home / Self Care | Payer: Self-pay | Source: Home / Self Care | Attending: Ophthalmology

## 2021-07-11 DIAGNOSIS — I499 Cardiac arrhythmia, unspecified: Secondary | ICD-10-CM | POA: Insufficient documentation

## 2021-07-11 DIAGNOSIS — H5201 Hypermetropia, right eye: Secondary | ICD-10-CM | POA: Diagnosis not present

## 2021-07-11 DIAGNOSIS — Z9841 Cataract extraction status, right eye: Secondary | ICD-10-CM | POA: Diagnosis not present

## 2021-07-11 DIAGNOSIS — H25812 Combined forms of age-related cataract, left eye: Secondary | ICD-10-CM | POA: Insufficient documentation

## 2021-07-11 DIAGNOSIS — Z961 Presence of intraocular lens: Secondary | ICD-10-CM | POA: Insufficient documentation

## 2021-07-11 DIAGNOSIS — H5712 Ocular pain, left eye: Secondary | ICD-10-CM | POA: Diagnosis not present

## 2021-07-11 SURGERY — CATARACT EXTRACTION PHACO AND INTRAOCULAR LENS PLACEMENT (IOC) with placement of Corticosteroid
Anesthesia: Monitor Anesthesia Care | Site: Eye | Laterality: Left

## 2021-07-11 MED ORDER — MIDAZOLAM HCL 2 MG/2ML IJ SOLN
INTRAMUSCULAR | Status: AC
  Filled 2021-07-11: qty 2

## 2021-07-11 MED ORDER — SODIUM CHLORIDE 0.9% FLUSH
INTRAVENOUS | Status: DC | PRN
  Administered 2021-07-11: 3 mL via INTRAVENOUS

## 2021-07-11 MED ORDER — ORAL CARE MOUTH RINSE: 15.0000 mL | Freq: Once | OROMUCOSAL | Status: DC

## 2021-07-11 MED ORDER — LIDOCAINE HCL 3.5 % OP GEL
1.0000 "application " | Freq: Once | OPHTHALMIC | Status: AC
  Administered 2021-07-11: 1 via OPHTHALMIC

## 2021-07-11 MED ORDER — CHLORHEXIDINE GLUCONATE 0.12 % MT SOLN: 15.0000 mL | Freq: Once | OROMUCOSAL | Status: DC

## 2021-07-11 MED ORDER — SODIUM HYALURONATE 10 MG/ML IO SOLUTION
PREFILLED_SYRINGE | INTRAOCULAR | Status: DC | PRN
  Administered 2021-07-11: 0.85 mL via INTRAOCULAR

## 2021-07-11 MED ORDER — DEXAMETHASONE 0.4 MG OP INST
VAGINAL_INSERT | OPHTHALMIC | Status: AC
  Filled 2021-07-11: qty 1

## 2021-07-11 MED ORDER — TETRACAINE HCL 0.5 % OP SOLN
1.0000 [drp] | OPHTHALMIC | Status: AC | PRN
  Administered 2021-07-11 (×3): 1 [drp] via OPHTHALMIC

## 2021-07-11 MED ORDER — BSS IO SOLN
INTRAOCULAR | Status: DC | PRN
  Administered 2021-07-11: 15 mL via INTRAOCULAR

## 2021-07-11 MED ORDER — POVIDONE-IODINE 5 % OP SOLN
OPHTHALMIC | Status: DC | PRN
  Administered 2021-07-11: 1 via OPHTHALMIC

## 2021-07-11 MED ORDER — STERILE WATER FOR IRRIGATION IR SOLN
Status: DC | PRN
  Administered 2021-07-11: 250 mL

## 2021-07-11 MED ORDER — TROPICAMIDE 1 % OP SOLN
1.0000 [drp] | OPHTHALMIC | Status: AC | PRN
  Administered 2021-07-11 (×3): 1 [drp] via OPHTHALMIC
  Filled 2021-07-11: qty 2

## 2021-07-11 MED ORDER — MIDAZOLAM HCL 2 MG/2ML IJ SOLN
INTRAMUSCULAR | Status: DC | PRN
  Administered 2021-07-11: 2 mg via INTRAVENOUS

## 2021-07-11 MED ORDER — EPINEPHRINE PF 1 MG/ML IJ SOLN
INTRAOCULAR | Status: DC | PRN
  Administered 2021-07-11: 500 mL

## 2021-07-11 MED ORDER — SODIUM HYALURONATE 23MG/ML IO SOSY
PREFILLED_SYRINGE | INTRAOCULAR | Status: DC | PRN
  Administered 2021-07-11: 0.6 mL via INTRAOCULAR

## 2021-07-11 MED ORDER — LIDOCAINE HCL (PF) 1 % IJ SOLN
INTRAOCULAR | Status: DC | PRN
  Administered 2021-07-11: 1 mL via OPHTHALMIC

## 2021-07-11 MED ORDER — PHENYLEPHRINE HCL 2.5 % OP SOLN
1.0000 [drp] | OPHTHALMIC | Status: AC | PRN
  Administered 2021-07-11 (×3): 1 [drp] via OPHTHALMIC

## 2021-07-11 MED ORDER — DEXAMETHASONE 0.4 MG OP INST
VAGINAL_INSERT | OPHTHALMIC | Status: DC | PRN
  Administered 2021-07-11: 0.4 mg via OPHTHALMIC

## 2021-07-11 SURGICAL SUPPLY — 15 items
CATARACT SUITE SIGHTPATH (MISCELLANEOUS) ×2 IMPLANT
CLOTH BEACON ORANGE TIMEOUT ST (SAFETY) ×2 IMPLANT
EYE SHIELD UNIVERSAL CLEAR (GAUZE/BANDAGES/DRESSINGS) ×1 IMPLANT
FEE CATARACT SUITE SIGHTPATH (MISCELLANEOUS) ×1 IMPLANT
GLOVE SURG UNDER POLY LF SZ6.5 (GLOVE) ×1 IMPLANT
GLOVE SURG UNDER POLY LF SZ7 (GLOVE) ×2 IMPLANT
LENS IOL RAYNER 17.0 (Intraocular Lens) ×2 IMPLANT
LENS IOL RAYONE EMV 17.0 (Intraocular Lens) IMPLANT
NDL HYPO 18GX1.5 BLUNT FILL (NEEDLE) ×1 IMPLANT
NEEDLE HYPO 18GX1.5 BLUNT FILL (NEEDLE) ×2 IMPLANT
PAD ARMBOARD 7.5X6 YLW CONV (MISCELLANEOUS) ×2 IMPLANT
SYR TB 1ML LL NO SAFETY (SYRINGE) ×2 IMPLANT
TAPE SURG TRANSPORE 1 IN (GAUZE/BANDAGES/DRESSINGS) IMPLANT
TAPE SURGICAL TRANSPORE 1 IN (GAUZE/BANDAGES/DRESSINGS) ×2
WATER STERILE IRR 250ML POUR (IV SOLUTION) ×2 IMPLANT

## 2021-07-11 NOTE — Anesthesia Procedure Notes (Signed)
Date/Time: 07/11/2021 11:30 AM Performed by: Vista Deck, CRNA Pre-anesthesia Checklist: Patient identified, Emergency Drugs available, Suction available, Timeout performed and Patient being monitored Patient Re-evaluated:Patient Re-evaluated prior to induction Oxygen Delivery Method: Nasal Cannula

## 2021-07-11 NOTE — Interval H&P Note (Signed)
History and Physical Interval Note:  07/11/2021 11:25 AM  Karen Nunez  has presented today for surgery, with the diagnosis of combined forms age related cataract; left.  The various methods of treatment have been discussed with the patient and family. After consideration of risks, benefits and other options for treatment, the patient has consented to  Procedure(s) with comments: CATARACT EXTRACTION PHACO AND INTRAOCULAR LENS PLACEMENT ([place]) with placement of Corticosteroid (Left) - left as a surgical intervention.  The patient's history has been reviewed, patient examined, no change in status, stable for surgery.  I have reviewed the patient's chart and labs.  Questions were answered to the patient's satisfaction.     Baruch Goldmann

## 2021-07-11 NOTE — Anesthesia Postprocedure Evaluation (Signed)
Anesthesia Post Note  Patient: Karen Nunez  Procedure(s) Performed: CATARACT EXTRACTION PHACO AND INTRAOCULAR LENS PLACEMENT ([facility]) with placement of Corticosteroid (Left: Eye)  Patient location during evaluation: Phase II Anesthesia Type: MAC Level of consciousness: awake and alert and oriented Pain management: pain level controlled Vital Signs Assessment: post-procedure vital signs reviewed and stable Respiratory status: spontaneous breathing, nonlabored ventilation and respiratory function stable Cardiovascular status: stable and blood pressure returned to baseline Postop Assessment: no apparent nausea or vomiting Anesthetic complications: no   No notable events documented.   Last Vitals:  Vitals:   07/11/21 1015 07/11/21 1154  BP: (!) 154/93 (!) 161/87  Pulse: 75 73  Resp: 14 16  Temp: 36.5 C 36.6 C  SpO2: 98% 100%    Last Pain:  Vitals:   07/11/21 1154  TempSrc: Oral  PainSc: 0-No pain                 [name] [name]

## 2021-07-11 NOTE — Discharge Instructions (Addendum)
Please discharge patient when stable, will follow up today with Dr. Wrzosek at the [facility] [place] office immediately following discharge.  Leave shield in place until visit.  All paperwork with discharge instructions will be given at the office.  [facility] Address:  [address]  [place], [place] [address]  

## 2021-07-11 NOTE — Anesthesia Preprocedure Evaluation (Addendum)
Anesthesia Evaluation  Patient identified by MRN, date of birth, ID band Patient awake    Reviewed: Allergy & Precautions, NPO status , Patient's Chart, lab work & pertinent test results, reviewed documented beta blocker date and time   Airway Mallampati: II  TM Distance: >3 FB Neck ROM: Full    Dental  (+) Dental Advisory Given, Partial Upper   Pulmonary neg pulmonary ROS,    Pulmonary exam normal breath sounds clear to auscultation       Cardiovascular Exercise Tolerance: Good (-) hypertensionPt. on medications Normal cardiovascular exam+ dysrhythmias  Rhythm:Regular Rate:Normal     Neuro/Psych negative neurological ROS  negative psych ROS   GI/Hepatic negative GI ROS, Neg liver ROS,   Endo/Other  negative endocrine ROS  Renal/GU negative Renal ROS     Musculoskeletal negative musculoskeletal ROS (+)   Abdominal   Peds  Hematology negative hematology ROS (+)   Anesthesia Other Findings   Reproductive/Obstetrics negative OB ROS                            Anesthesia Physical Anesthesia Plan  ASA: 2  Anesthesia Plan: MAC   Post-op Pain Management: Minimal or no pain anticipated   Induction:   PONV Risk Score and Plan: Treatment may vary due to age or medical condition  Airway Management Planned: Nasal Cannula and Natural Airway  Additional Equipment:   Intra-op Plan:   Post-operative Plan:   Informed Consent: I have reviewed the patients History and Physical, chart, labs and discussed the procedure including the risks, benefits and alternatives for the proposed anesthesia with the patient or authorized representative who has indicated his/her understanding and acceptance.     Dental advisory given  Plan Discussed with: CRNA and Surgeon  Anesthesia Plan Comments:         Anesthesia Quick Evaluation

## 2021-07-11 NOTE — Op Note (Signed)
Date of procedure: 07/11/21  Pre-operative diagnosis: Visually significant age-related combined cataract, Left Eye (H25.812)  Post-operative diagnosis:  Visually significant age-related combined cataract, Left Eye (H25.812) 2.   Pain and inflammation following cataract surgery, Left Eye (H57.12)  Procedure:  Removal of cataract via phacoemulsification and insertion of intra-ocular lens Rayner [id number] +17.0D into the capsular bag of the Left Eye 2. Placement of Dextenza Implant, Left Lower Lid  Attending surgeon: Gerda Diss. Hector Taft, MD, MA  Anesthesia: MAC, Topical [place]  Complications: None  Estimated Blood Loss: <76m (minimal)  Specimens: None  Implants: As above  Indications:  Visually significant age-related cataract, Left Eye  Procedure:  The patient was seen and identified in the pre-operative area. The operative eye was identified and dilated.  The operative eye was marked.  Topical anesthesia was administered to the operative eye.     The patient was then to the operative suite and placed in the supine position.  A timeout was performed confirming the patient, procedure to be performed, and all other relevant information.   The patient's face was prepped and draped in the usual fashion for intra-ocular surgery.  A lid speculum was placed into the operative eye and the surgical microscope moved into place and focused.  An inferotemporal paracentesis was created using a 20 gauge paracentesis blade.  Shugarcaine was injected into the anterior chamber.  Viscoelastic was injected into the anterior chamber.  A temporal clear-corneal main wound incision was created using a 2.455mmicrokeratome.  A continuous curvilinear capsulorrhexis was initiated using an irrigating cystitome and completed using capsulorrhexis forceps.  Hydrodissection and hydrodeliniation were performed.  Viscoelastic was injected into the anterior chamber.  A phacoemulsification handpiece and a chopper as a second  instrument were used to remove the nucleus and epinucleus. The irrigation/aspiration handpiece was used to remove any remaining cortical material.   The capsular bag was reinflated with viscoelastic, checked, and found to be intact.  The intraocular lens was inserted into the capsular bag.  The irrigation/aspiration handpiece was used to remove any remaining viscoelastic.  The clear corneal wound and paracentesis wounds were then hydrated and checked with Weck-Cels to be watertight.    The lid-speculum was removed. The lower punctum was dilated. A Dextenza implant was placed in the lower canaliculus without complication.  The drape was removed.  The patient's face was cleaned with a wet and dry 4x4.   A clear shield was taped over the eye. The patient was taken to the post-operative care unit in good condition, having tolerated the procedure well.  Post-Op Instructions: [facility] and will receive all other orders and instructions.

## 2021-07-11 NOTE — Transfer of Care (Addendum)
Immediate Anesthesia Transfer of Care Note  Patient: Karen Nunez  Procedure(s) Performed: CATARACT EXTRACTION PHACO AND INTRAOCULAR LENS PLACEMENT ([facility]) with placement of Corticosteroid (Left: Eye)  Patient Location: Short Stay  Anesthesia Type:MAC  Level of Consciousness: awake and patient cooperative  Airway & Oxygen Therapy: Patient Spontanous Breathing  Post-op Assessment: Report given to RN and Post -op Vital signs reviewed and stable  Post vital signs: Reviewed and stable  Last Vitals:  Vitals Value Taken Time  BP    Temp    Pulse    Resp    SpO2     SEE PACU FLOW SHEET FOR VITAL SIGN Last Pain:  Vitals:   07/11/21 1015  TempSrc: Oral  PainSc: 0-No pain      Patients Stated Pain Goal: 8 (68/34/19 [id number])  Complications: No notable events documented.

## 2021-11-16 NOTE — Progress Notes (Unsigned)
Cardiology Office Note:    Date:  11/17/2021   ID:  GLORIAJEAN Nunez, DOB 04/03/1946, MRN [id number]  PCP:  Celene Squibb, MD  Cardiologist:  None  Electrophysiologist:  None   Referring MD: Celene Squibb, MD   No chief complaint on file.   History of Present Illness:    Karen Nunez is a 76 y.o. female with no significant past medical history who presents for follow-up.  She was referred by Dr. Nevada Crane for evaluation of palpitations, initially seen on 04/16/2020.  EKG in clinic with Dr. [place] Crane showed sinus rhythm with multiple PVCs, 5 beat run of NSVT.  She reports she was asymptomatic that day.  Does report she has rare palpitations, less than once per month.  She is active, walks 3-4 times per week for 4 miles each time, denies any chest pain or dyspnea.  Reports occasional lightheadedness with sitting up, denies any syncope.  She drinks 2 cups of coffee per day and usually does not drink other caffeine, but states the day before her appointment with Dr. Nevada Crane she drank a large cup of [facility].  No smoking history.  Family history includes father had CVA in 12s, brother had CVA in 42s, and sister had CVAs in 71s.    Labs 04/01/20 showed Hgb 13.0, plts 219, WBC 6.8, Cr 0.78, K 4.7, LDL 79.  Echocardiogram on 04/18/2020 showed normal biventricular function, normal diastolic function, mild left atrial dilatation, mild MR.  Zio patch x13 days on 05/10/2020 showed 553 episodes of SVT, longest lasting 18 seconds, 146 episodes of NSVT, longest lasting 10 beats, frequent PVCs (7.9% of beats), occasional PACs (1.6% of beats).  Started on metoprolol.  Repeat Zio patch x3 days on 08/06/2020 showed 8 episodes of SVT, longest lasting 9 beats, no NSVT, occasional PVCs (2.7%).  Since last clinic visit, she reports that she has been doing well.  Reports rare palpitations.  Denies any chest pain, dyspnea, lightheadedness, syncope, lower extremity edema.  Walks 3 days/week for 60 minutes.  Denies any exertional  symptoms.  Drinks 1.5 cups of coffee per day.   Past Medical History:  Diagnosis Date   Adenomatous colon polyp 2012   Cataract    Dysrhythmia    palpitations   Osteoporosis     Past Surgical History:  Procedure Laterality Date   ABDOMINAL HYSTERECTOMY     Bilateral breast lumpectomies     all benign   BREAST SURGERY     CESAREAN SECTION     COLONOSCOPY  09/25/2011   Procedure: COLONOSCOPY;  Surgeon: Danie Binder, MD;  [facility];  Service: Endoscopy;  Laterality: N/A;  10:30 AM   COLONOSCOPY N/A 04/19/2017   Procedure: COLONOSCOPY;  Surgeon: Danie Binder, MD;  Location: [facility];  Service: Endoscopy;  Laterality: N/A;  12:45 pm    Current Medications: Current Meds  Medication Sig   aspirin 81 MG chewable tablet Chew 81 mg by mouth daily.   cetirizine (ZYRTEC) 10 MG tablet Take 10 mg by mouth daily.   ibuprofen (ADVIL,MOTRIN) 200 MG tablet Take 400 mg daily as needed by mouth for headache or moderate pain.   Lysine 1000 MG TABS Take 1,000 mg daily by mouth.   metoprolol succinate (TOPROL-XL) 50 MG 24 hr tablet TAKE 1 TABLET(50 MG) BY MOUTH DAILY WITH OR IMMEDIATELY FOLLOWING A MEAL     Allergies:   Patient has no known allergies.   Social History   Socioeconomic History  Marital status: Married    Spouse name: Not on file   Number of children: Not on file   Years of education: Not on file   Highest education level: Not on file  Occupational History   Occupation: retired  Tobacco Use   Smoking status: Never   Smokeless tobacco: Never  Vaping Use   Vaping Use: Never used  Substance and Sexual Activity   Alcohol use: Yes    Comment: occasional   Drug use: No   Sexual activity: Not on file  Other Topics Concern   Not on file  Social History Narrative   Not on file   Social Determinants of Health   Financial Resource Strain: Not on file  Food Insecurity: Not on file  Transportation Needs: Not on file  Physical Activity: Not on file   Stress: Not on file  Social Connections: Not on file     Family History: The patient's family history includes Cancer in her mother and sister; Colon cancer (age of onset: 34) in her mother; Diabetes in her brother; Stroke in her father and sister. There is no history of Colon polyps.  ROS:   Please see the history of present illness.     All other systems reviewed and are negative.  EKGs/Labs/Other Studies Reviewed:    The following studies were reviewed today:  3 Day Monitor 08/06/2020: 8 runs of SVT, longest lasting 9 beats Occasional PVCs (2.7%) and supraventricular couplets (1.7%)     Patch Wear Time:  3 days and 0 hours (2022-02-17T08:16:25-0500 to 2022-02-20T08:19:51-0500)   Patient had a min HR of 51 bpm, max HR of 176 bpm, and avg HR of 77 bpm. Predominant underlying rhythm was Sinus Rhythm. 8 Supraventricular Tachycardia runs occurred, the run with the fastest interval lasting 5 beats with a max rate of 176 bpm, the  longest lasting 9 beats with an avg rate of 129 bpm. Supraventricular Tachycardia was detected within +/- 45 seconds of symptomatic patient event(s). Isolated SVEs were rare (<1.0%, 1880), SVE Couplets were occasional (1.7%, 2786), and SVE Triplets were  rare (<1.0%, 83). Isolated VEs were occasional (2.7%, 9263), VE Couplets were rare (<1.0%, 442), and no VE Triplets were present. Ventricular Bigeminy and Trigeminy were present.  14 patient triggered events, corresponding to sinus rhythm  PACs/PVCs and SVT  Echo 04/18/2020: 1. Left ventricular ejection fraction, by estimation, is approximately  55%. The left ventricle has normal function. The left ventricle has no  regional wall motion abnormalities. Left ventricular diastolic parameters  were normal.   2. Right ventricular systolic function is normal. The right ventricular  size is normal. Tricuspid regurgitation signal is inadequate for assessing  PA pressure.   3. Left atrial size was mildly dilated.    4. The mitral valve is grossly normal. Mild mitral valve regurgitation.   5. The aortic valve is tricuspid. Aortic valve regurgitation is not  visualized.   6. The inferior vena cava is normal in size with greater than 50%  respiratory variability, suggesting right atrial pressure of 3 mmHg.  EKG:   11/17/2021: Normal sinus rhythm, rate 68, no ST abnormalities 10/21/2020: normal sinus rhythm, rate 61, no ST/T abnormalities 07/22/2020: normal sinus rhythm, rate 71, no PVCs 04/16/2020: normal sinus rhythm, rate 98, LVH  Recent Labs: No results found for requested labs within last 365 days.  Recent Lipid Panel    Component Value Date/Time   CHOL 137 01/31/2019 0000   TRIG 44 01/31/2019 0000   HDL 60 01/31/2019  0000   LDLCALC 68 01/31/2019 0000    Physical Exam:    VS:  BP 130/78   Pulse 68   Ht 5' 4.5" (1.638 m)   Wt 175 lb 9.6 oz (79.7 kg)   SpO2 98%   BMI 29.68 kg/m     Wt Readings from Last 3 Encounters:  11/17/21 175 lb 9.6 oz (79.7 kg)  06/23/21 168 lb (76.2 kg)  10/21/20 167 lb 3.2 oz (75.8 kg)     GEN:   in no acute distress HEENT: Normal NECK: No JVD; No carotid bruits CARDIAC: RRR, no murmurs, rubs, gallops RESPIRATORY:  Clear to auscultation without rales, wheezing or rhonchi  ABDOMEN: Soft, non-tender, non-distended MUSCULOSKELETAL:  No edema; No deformity  SKIN: Warm and dry NEUROLOGIC:  Alert and oriented x 3 PSYCHIATRIC:  Normal affect   ASSESSMENT:    1. SVT (supraventricular tachycardia) ([place])   2. PVC's (premature ventricular contractions)   3. NSVT (nonsustained ventricular tachycardia) ([facility])     PLAN:     Frequent PVCs/NSVT/SVT: Zio patch x13 days on 05/10/2020 showed numerous episodes of SVT, longest lasting 18 seconds, numerous episodes of NSVT, longest lasting 10 beats, frequent PVCs (7.9% of beats), occasional PACs (1.6% of beats).  Echocardiogram on 04/18/2020 showed normal biventricular function, normal diastolic function, mild left atrial  dilatation, mild MR.  Repeat Zio patch x3 days on 08/06/2020 showed 8 episodes of SVT, longest lasting 9 beats, no NSVT, occasional PVCs (2.7%). -Significant improvement in PVC/SVT/NSVT since starting metoprolol and reports palpitations have resolved.  Continue Toprol-XL 50 mg daily  Cardiac risk stratification: Recommend calcium score for further evaluation   RTC in 1 year  Medication Adjustments/Labs and Tests Ordered: Current medicines are reviewed at length with the patient today.  Concerns regarding medicines are outlined above.  Orders Placed This Encounter  Procedures   CT CARDIAC SCORING (SELF PAY ONLY)   EKG 12-Lead   No orders of the defined types were placed in this encounter.   Patient Instructions  Medication Instructions:  Your physician recommends that you continue on your current medications as directed. Please refer to the Current Medication list given to you today.  *If you need a refill on your cardiac medications before your next appointment, please call your pharmacy*  Testing/Procedures: CT coronary calcium score.  [facility])  This is $99 out of pocket.   Coronary CalciumScan A coronary calcium scan is an imaging test used to look for deposits of calcium and other fatty materials (plaques) in the inner lining of the blood vessels of the heart (coronary arteries). These deposits of calcium and plaques can partly clog and narrow the coronary arteries without producing any symptoms or warning signs. This puts a person at risk for a heart attack. This test can detect these deposits before symptoms develop. Tell a health care provider about: Any allergies you have. All medicines you are taking, including vitamins, herbs, eye drops, creams, and over-the-counter medicines. Any problems you or family members have had with anesthetic medicines. Any blood disorders you have. Any surgeries you have had. Any medical conditions you have. Whether you are pregnant or may  be pregnant. What are the risks? Generally, this is a safe procedure. However, problems may occur, including: Harm to a pregnant woman and her unborn baby. This test involves the use of radiation. Radiation exposure can be dangerous to a pregnant woman and her unborn baby. If you are pregnant, you generally should not have this procedure done. Slight increase  in the risk of cancer. This is because of the radiation involved in the test. What happens before the procedure? No preparation is needed for this procedure. What happens during the procedure? You will undress and remove any jewelry around your neck or chest. You will put on a hospital gown. Sticky electrodes will be placed on your chest. The electrodes will be connected to an electrocardiogram (ECG) machine to record a tracing of the electrical activity of your heart. A CT scanner will take pictures of your heart. During this time, you will be asked to lie still and hold your breath for 2-3 seconds while a picture of your heart is being taken. The procedure may vary among health care providers and hospitals. What happens after the procedure? You can get dressed. You can return to your normal activities. It is up to you to get the results of your test. Ask your health care provider, or the department that is doing the test, when your results will be ready. Summary A coronary calcium scan is an imaging test used to look for deposits of calcium and other fatty materials (plaques) in the inner lining of the blood vessels of the heart (coronary arteries). Generally, this is a safe procedure. Tell your health care provider if you are pregnant or may be pregnant. No preparation is needed for this procedure. A CT scanner will take pictures of your heart. You can return to your normal activities after the scan is done. This information is not intended to replace advice given to you by your health care provider. Make sure you discuss any questions  you have with your health care provider. Document Released: 11/21/2007 Document Revised: 04/13/2016 Document Reviewed: 04/13/2016 Elsevier Interactive Patient Education  2017 [place]: At [facility], you and your health needs are our priority.  As part of our continuing mission to provide you with exceptional heart care, we have created designated Provider Care Teams.  These Care Teams include your primary Cardiologist (physician) and Advanced Practice Providers (APPs -  Physician Assistants and Nurse Practitioners) who all work together to provide you with the care you need, when you need it.  We recommend signing up for the patient portal called "MyChart".  Sign up information is provided on this After Visit Summary.  MyChart is used to connect with patients for Virtual Visits (Telemedicine).  Patients are able to view lab/test results, encounter notes, upcoming appointments, etc.  Non-urgent messages can be sent to your provider as well.   To learn more about what you can do with MyChart, go to [email or web address].    Your next appointment:   12 month(s)  The format for your next appointment:   In Person  Provider:   Dr. Gardiner Rhyme  Important Information About Sugar           Signed, Donato Heinz, MD  11/17/2021 9:26 AM    [place]

## 2021-11-17 ENCOUNTER — Ambulatory Visit (INDEPENDENT_AMBULATORY_CARE_PROVIDER_SITE_OTHER): Payer: Medicare PPO | Admitting: Cardiology

## 2021-11-17 ENCOUNTER — Encounter: Payer: Self-pay | Admitting: Cardiology

## 2021-11-17 VITALS — BP 130/78 | HR 68 | Ht 64.5 in | Wt 175.6 lb

## 2021-11-17 DIAGNOSIS — I471 Supraventricular tachycardia: Secondary | ICD-10-CM

## 2021-11-17 DIAGNOSIS — I4729 Other ventricular tachycardia: Secondary | ICD-10-CM

## 2021-11-17 DIAGNOSIS — I493 Ventricular premature depolarization: Secondary | ICD-10-CM

## 2021-11-17 NOTE — Patient Instructions (Signed)
Medication Instructions:  Your physician recommends that you continue on your current medications as directed. Please refer to the Current Medication list given to you today.  *If you need a refill on your cardiac medications before your next appointment, please call your pharmacy*  Testing/Procedures: CT coronary calcium score.  [facility])  This is $99 out of pocket.   Coronary CalciumScan A coronary calcium scan is an imaging test used to look for deposits of calcium and other fatty materials (plaques) in the inner lining of the blood vessels of the heart (coronary arteries). These deposits of calcium and plaques can partly clog and narrow the coronary arteries without producing any symptoms or warning signs. This puts a person at risk for a heart attack. This test can detect these deposits before symptoms develop. Tell a health care provider about: Any allergies you have. All medicines you are taking, including vitamins, herbs, eye drops, creams, and over-the-counter medicines. Any problems you or family members have had with anesthetic medicines. Any blood disorders you have. Any surgeries you have had. Any medical conditions you have. Whether you are pregnant or may be pregnant. What are the risks? Generally, this is a safe procedure. However, problems may occur, including: Harm to a pregnant woman and her unborn baby. This test involves the use of radiation. Radiation exposure can be dangerous to a pregnant woman and her unborn baby. If you are pregnant, you generally should not have this procedure done. Slight increase in the risk of cancer. This is because of the radiation involved in the test. What happens before the procedure? No preparation is needed for this procedure. What happens during the procedure? You will undress and remove any jewelry around your neck or chest. You will put on a hospital gown. Sticky electrodes will be placed on your chest. The electrodes will be  connected to an electrocardiogram (ECG) machine to record a tracing of the electrical activity of your heart. A CT scanner will take pictures of your heart. During this time, you will be asked to lie still and hold your breath for 2-3 seconds while a picture of your heart is being taken. The procedure may vary among health care providers and hospitals. What happens after the procedure? You can get dressed. You can return to your normal activities. It is up to you to get the results of your test. Ask your health care provider, or the department that is doing the test, when your results will be ready. Summary A coronary calcium scan is an imaging test used to look for deposits of calcium and other fatty materials (plaques) in the inner lining of the blood vessels of the heart (coronary arteries). Generally, this is a safe procedure. Tell your health care provider if you are pregnant or may be pregnant. No preparation is needed for this procedure. A CT scanner will take pictures of your heart. You can return to your normal activities after the scan is done. This information is not intended to replace advice given to you by your health care provider. Make sure you discuss any questions you have with your health care provider. Document Released: 11/21/2007 Document Revised: 04/13/2016 Document Reviewed: 04/13/2016 Elsevier Interactive Patient Education  2017 [place]: At [facility], you and your health needs are our priority.  As part of our continuing mission to provide you with exceptional heart care, we have created designated Provider Care Teams.  These Care Teams include your primary Cardiologist (physician) and Advanced Practice  Providers (APPs -  Physician Assistants and Nurse Practitioners) who all work together to provide you with the care you need, when you need it.  We recommend signing up for the patient portal called "MyChart".  Sign up information is provided on  this After Visit Summary.  MyChart is used to connect with patients for Virtual Visits (Telemedicine).  Patients are able to view lab/test results, encounter notes, upcoming appointments, etc.  Non-urgent messages can be sent to your provider as well.   To learn more about what you can do with MyChart, go to [email or web address].    Your next appointment:   12 month(s)  The format for your next appointment:   In Person  Provider:   Dr. Gardiner Rhyme  Important Information About Sugar

## 2021-12-15 ENCOUNTER — Ambulatory Visit (HOSPITAL_COMMUNITY)
Admission: RE | Admit: 2021-12-15 | Discharge: 2021-12-15 | Disposition: A | Discharge status: Home / Self Care | Payer: Self-pay | Source: Ambulatory Visit | Attending: Cardiology | Admitting: Cardiology

## 2021-12-15 DIAGNOSIS — I471 Supraventricular tachycardia: Secondary | ICD-10-CM | POA: Insufficient documentation

## 2021-12-15 DIAGNOSIS — I493 Ventricular premature depolarization: Secondary | ICD-10-CM | POA: Insufficient documentation

## 2022-01-21 DIAGNOSIS — I471 Supraventricular tachycardia: Secondary | ICD-10-CM | POA: Diagnosis not present

## 2022-01-28 ENCOUNTER — Other Ambulatory Visit (HOSPITAL_COMMUNITY): Payer: Self-pay | Admitting: Internal Medicine

## 2022-01-28 DIAGNOSIS — Z6829 Body mass index (BMI) 29.0-29.9, adult: Secondary | ICD-10-CM | POA: Diagnosis not present

## 2022-01-28 DIAGNOSIS — I471 Supraventricular tachycardia: Secondary | ICD-10-CM | POA: Diagnosis not present

## 2022-01-28 DIAGNOSIS — Z13820 Encounter for screening for osteoporosis: Secondary | ICD-10-CM

## 2022-01-28 DIAGNOSIS — H269 Unspecified cataract: Secondary | ICD-10-CM | POA: Diagnosis not present

## 2022-01-28 DIAGNOSIS — Z823 Family history of stroke: Secondary | ICD-10-CM | POA: Diagnosis not present

## 2022-02-27 ENCOUNTER — Ambulatory Visit (HOSPITAL_COMMUNITY)
Admission: RE | Admit: 2022-02-27 | Discharge: 2022-02-27 | Disposition: A | Discharge status: Home / Self Care | Payer: Medicare PPO | Source: Ambulatory Visit | Attending: Internal Medicine | Admitting: Internal Medicine

## 2022-02-27 DIAGNOSIS — Z13820 Encounter for screening for osteoporosis: Secondary | ICD-10-CM | POA: Diagnosis not present

## 2022-02-27 DIAGNOSIS — Z780 Asymptomatic menopausal state: Secondary | ICD-10-CM | POA: Diagnosis not present

## 2022-02-27 DIAGNOSIS — M85852 Other specified disorders of bone density and structure, left thigh: Secondary | ICD-10-CM | POA: Insufficient documentation

## 2022-03-04 DIAGNOSIS — H43393 Other vitreous opacities, bilateral: Secondary | ICD-10-CM | POA: Diagnosis not present

## 2022-03-20 DIAGNOSIS — Z23 Encounter for immunization: Secondary | ICD-10-CM | POA: Diagnosis not present

## 2022-04-07 ENCOUNTER — Encounter: Payer: Self-pay | Admitting: *Deleted

## 2022-04-20 ENCOUNTER — Telehealth: Payer: Self-pay | Admitting: Gastroenterology

## 2022-04-20 ENCOUNTER — Telehealth: Payer: Self-pay | Admitting: Internal Medicine

## 2022-04-20 NOTE — Telephone Encounter (Signed)
Patient left a message to get her colonoscopy scheduled.  I do see where she was sent a letter but didn't know if you guys had it.

## 2022-04-20 NOTE — Telephone Encounter (Signed)
Patient's spouse was seen in the office today and stated that his wife had been trying to call to set up a colonoscopy.  Please give her a call back.

## 2022-04-20 NOTE — Telephone Encounter (Signed)
We just received her triage Thursday.   Procedure: Colonoscopy  Estimated body mass index is 29.68 kg/m as calculated from the following:   Height as of 11/17/21: 5' 4.5" (1.638 m).   Weight as of 11/17/21: 175 lb 9.6 oz (79.7 kg).   Have you had a colonoscopy before?  04/19/17, Dr. Oneida Alar  Do you have family history of colon cancer  yes mother  Do you have a family history of polyps? yes  Previous colonoscopy with polyps removed? yes  Do you have a history colorectal cancer?   no  Are you diabetic?  no  Do you have a prosthetic or mechanical heart valve? no  Do you have a pacemaker/defibrillator?   no  Have you had endocarditis/atrial fibrillation?  yes  Do you use supplemental oxygen/CPAP?  no  Have you had joint replacement within the last 12 months?  no  Do you tend to be constipated or have to use laxatives?  no   Do you have history of alcohol use? If yes, how much and how often.  no  Do you have history or are you using drugs? If yes, what do are you  using?  no  Have you ever had a stroke/heart attack?  no  Have you ever had a heart or other vascular stent placed,?no  Do you take weight loss medication? no  female patients,: have you had a hysterectomy? yes                              are you post menopausal?  no                              do you still have your menstrual cycle? no    Date of last menstrual period. N/a  Do you take any blood-thinning medications such as: (Plavix, aspirin, Coumadin, Aggrenox, Brilinta, Xarelto, Eliquis, Pradaxa, Savaysa or Effient) aspirin '81mg'$   If yes we need the name, milligram, dosage and who is prescribing doctor:               Current Outpatient Medications  Medication Sig Dispense Refill   aspirin 81 MG chewable tablet Chew 81 mg by mouth daily.     cetirizine (ZYRTEC) 10 MG tablet Take 10 mg by mouth daily.     ibuprofen (ADVIL,MOTRIN) 200 MG tablet Take 400 mg daily as needed by mouth for headache or moderate  pain.     Lysine 1000 MG TABS Take 1,000 mg daily by mouth.     metoprolol succinate (TOPROL-XL) 50 MG 24 hr tablet TAKE 1 TABLET(50 MG) BY MOUTH DAILY WITH OR IMMEDIATELY FOLLOWING A MEAL 90 tablet 3   No current facility-administered medications for this visit.    No Known Allergies

## 2022-04-21 ENCOUNTER — Other Ambulatory Visit: Payer: Self-pay | Admitting: Cardiology

## 2022-04-21 NOTE — Telephone Encounter (Signed)
ASA 2, okay to schedule

## 2022-04-22 ENCOUNTER — Encounter: Payer: Self-pay | Admitting: *Deleted

## 2022-04-22 MED ORDER — PEG 3350-KCL-NA BICARB-NACL 420 G PO SOLR: 4000.0000 mL | Freq: Once | ORAL | 0 refills | Status: AC

## 2022-04-22 NOTE — Telephone Encounter (Signed)
Pt is scheduled for 05/05/22 at 9:30 am. Instructions mailed to pt. Prep sent to the pharmacy.    PA via [employer]: Approved Authorization #[id number]  Tracking #[id number]:    DOS: 05/05/22-08/03/22

## 2022-04-23 NOTE — Telephone Encounter (Signed)
Pt has been scheduled for 05/05/22 at 9:30 am. Instructions sent via MyChart and prep sent to the pharmacy.

## 2022-05-05 ENCOUNTER — Ambulatory Visit (HOSPITAL_COMMUNITY)
Admission: RE | Admit: 2022-05-05 | Discharge: 2022-05-05 | Disposition: A | Discharge status: Home / Self Care | Payer: Medicare PPO | Attending: Internal Medicine | Admitting: Internal Medicine

## 2022-05-05 ENCOUNTER — Ambulatory Visit (HOSPITAL_COMMUNITY): Payer: Medicare PPO | Admitting: Anesthesiology

## 2022-05-05 ENCOUNTER — Ambulatory Visit (HOSPITAL_BASED_OUTPATIENT_CLINIC_OR_DEPARTMENT_OTHER): Payer: Medicare PPO | Admitting: Anesthesiology

## 2022-05-05 ENCOUNTER — Other Ambulatory Visit: Payer: Self-pay

## 2022-05-05 ENCOUNTER — Encounter (HOSPITAL_COMMUNITY): Payer: Self-pay

## 2022-05-05 ENCOUNTER — Encounter (HOSPITAL_COMMUNITY)
Admission: RE | Disposition: A | Discharge status: Home / Self Care | Payer: Self-pay | Source: Home / Self Care | Attending: Internal Medicine

## 2022-05-05 DIAGNOSIS — Z800 Family history of malignant neoplasm of digestive organs: Secondary | ICD-10-CM

## 2022-05-05 DIAGNOSIS — K5730 Diverticulosis of large intestine without perforation or abscess without bleeding: Secondary | ICD-10-CM | POA: Insufficient documentation

## 2022-05-05 DIAGNOSIS — I499 Cardiac arrhythmia, unspecified: Secondary | ICD-10-CM | POA: Diagnosis not present

## 2022-05-05 DIAGNOSIS — Z1211 Encounter for screening for malignant neoplasm of colon: Secondary | ICD-10-CM

## 2022-05-05 DIAGNOSIS — Z1212 Encounter for screening for malignant neoplasm of rectum: Secondary | ICD-10-CM

## 2022-05-05 HISTORY — DX: Other seasonal allergic rhinitis: J30.2

## 2022-05-05 HISTORY — PX: COLONOSCOPY WITH PROPOFOL: SHX5780

## 2022-05-05 SURGERY — COLONOSCOPY WITH PROPOFOL
Anesthesia: General

## 2022-05-05 MED ORDER — LIDOCAINE HCL (CARDIAC) PF 100 MG/5ML IV SOSY
PREFILLED_SYRINGE | INTRAVENOUS | Status: DC | PRN
  Administered 2022-05-05: 50 mg via INTRATRACHEAL

## 2022-05-05 MED ORDER — STERILE WATER FOR IRRIGATION IR SOLN
Status: DC | PRN
  Administered 2022-05-05: 60 mL

## 2022-05-05 MED ORDER — LACTATED RINGERS IV SOLN: INTRAVENOUS | Status: DC

## 2022-05-05 MED ORDER — PROPOFOL 500 MG/50ML IV EMUL
INTRAVENOUS | Status: DC | PRN
  Administered 2022-05-05: 200 ug/kg/min via INTRAVENOUS

## 2022-05-05 MED ORDER — PROPOFOL 10 MG/ML IV BOLUS
INTRAVENOUS | Status: DC | PRN
  Administered 2022-05-05: 80 mg via INTRAVENOUS

## 2022-05-05 NOTE — H&P (Signed)
Primary Care Physician:  Celene Squibb, MD Primary Gastroenterologist:  Dr. Abbey Chatters  Pre-Procedure History & Physical: HPI:  Karen Nunez is a 76 y.o. female is here for a colonoscopy to be performed for high risk colon cancer screening purposes, family history of colon cancer in mother  Past Medical History:  Diagnosis Date   Adenomatous colon polyp 2012   Cataract    Dysrhythmia    palpitations   Osteoporosis    Seasonal allergies     Past Surgical History:  Procedure Laterality Date   ABDOMINAL HYSTERECTOMY     Bilateral breast lumpectomies     all benign   BREAST SURGERY     CESAREAN SECTION     COLONOSCOPY  09/25/2011   Procedure: COLONOSCOPY;  Surgeon: Danie Binder, MD;  Location: [facility];  Service: Endoscopy;  Laterality: N/A;  10:30 AM   COLONOSCOPY N/A 04/19/2017   Procedure: COLONOSCOPY;  Surgeon: Danie Binder, MD;  Location: [facility];  Service: Endoscopy;  Laterality: N/A;  12:45 pm    Prior to Admission medications   Medication Sig Start Date End Date Taking? Authorizing Provider  acetaminophen (TYLENOL) 325 MG tablet Take 325 mg by mouth every 6 (six) hours as needed for headache.   Yes [provider]  aspirin 81 MG chewable tablet Chew 81 mg by mouth daily.   Yes [provider]  Calcium Carb-Cholecalciferol (CALCIUM + VITAMIN D3 PO) Take 1,200 mg by mouth daily.   Yes [provider]  cetirizine (ZYRTEC) 10 MG tablet Take 10 mg by mouth daily.   Yes [provider]  Lysine 1000 MG TABS Take 1,000 mg daily by mouth.   Yes [provider]  metoprolol succinate (TOPROL-XL) 50 MG 24 hr tablet TAKE 1 TABLET(50 MG) BY MOUTH DAILY WITH OR IMMEDIATELY FOLLOWING A MEAL 04/21/22  Yes Donato Heinz, MD  Polyethyl Glycol-Propyl Glycol (SYSTANE ULTRA) 0.4-0.3 % SOLN Place 1 drop into both eyes 2 (two) times daily.   Yes [provider]    Allergies as of 04/22/2022   (No Known Allergies)     Family History  Problem Relation Age of Onset   Colon cancer Mother 70       mets to liver   Cancer Mother    Stroke Father    Stroke Sister    Cancer Sister    Diabetes Brother    Colon polyps Neg Hx     Social History   Socioeconomic History   Marital status: Married    Spouse name: Not on file   Number of children: Not on file   Years of education: Not on file   Highest education level: Not on file  Occupational History   Occupation: retired  Tobacco Use   Smoking status: Never   Smokeless tobacco: Never  Vaping Use   Vaping Use: Never used  Substance and Sexual Activity   Alcohol use: Yes    Comment: occasional   Drug use: No   Sexual activity: Not on file  Other Topics Concern   Not on file  Social History Narrative   Not on file   Social Determinants of Health   Financial Resource Strain: Not on file  Food Insecurity: Not on file  Transportation Needs: Not on file  Physical Activity: Not on file  Stress: Not on file  Social Connections: Not on file  Intimate Partner Violence: Not on file    Review of Systems: See HPI, otherwise  negative ROS  Physical Exam: Vital signs in last 24 hours: Temp:  [97.8 F (36.6 C)] 97.8 F (36.6 C) (11/28 0806) Pulse Rate:  [85] 85 (11/28 0806) Resp:  [17] 17 (11/28 0806) BP: (150)/(77) 150/77 (11/28 0806) SpO2:  [97 %] 97 % (11/28 0806) Weight:  [77.1 kg] 77.1 kg (11/28 0806)   General:   Alert,  Well-developed, well-nourished, pleasant and cooperative in NAD Head:  Normocephalic and atraumatic. Eyes:  Sclera clear, no icterus.   Conjunctiva pink. Ears:  Normal auditory acuity. Nose:  No deformity, discharge,  or lesions. Mouth:  No deformity or lesions, dentition normal. Neck:  Supple; no masses or thyromegaly. Msk:  Symmetrical without gross deformities. Normal posture. Extremities:  Without clubbing or edema. Neurologic:  Alert and  oriented x4;  grossly normal neurologically. Skin:  Intact without  significant lesions or rashes. Psych:  Alert and cooperative. Normal mood and affect.  Impression/Plan: [name] is here for a colonoscopy to be performed for high risk colon cancer screening purposes, family history of colon cancer in mother  The risks of the procedure including infection, bleed, or perforation as well as benefits, limitations, alternatives and imponderables have been reviewed with the patient. Questions have been answered. All parties agreeable.

## 2022-05-05 NOTE — Discharge Instructions (Addendum)
  Colonoscopy Discharge Instructions  Read the instructions outlined below and refer to this sheet in the next few weeks. These discharge instructions provide you with general information on caring for yourself after you leave the hospital. Your doctor may also give you specific instructions. While your treatment has been planned according to the most current medical practices available, unavoidable complications occasionally occur.   ACTIVITY You may resume your regular activity, but move at a slower pace for the next 24 hours.  Take frequent rest periods for the next 24 hours.  Walking will help get rid of the air and reduce the bloated feeling in your belly (abdomen).  No driving for 24 hours (because of the medicine (anesthesia) used during the test).   Do not sign any important legal documents or operate any machinery for 24 hours (because of the anesthesia used during the test).  NUTRITION Drink plenty of fluids.  You may resume your normal diet as instructed by your doctor.  Begin with a light meal and progress to your normal diet. Heavy or fried foods are harder to digest and may make you feel sick to your stomach (nauseated).  Avoid alcoholic beverages for 24 hours or as instructed.  MEDICATIONS You may resume your normal medications unless your doctor tells you otherwise.  WHAT YOU CAN EXPECT TODAY Some feelings of bloating in the abdomen.  Passage of more gas than usual.  Spotting of blood in your stool or on the toilet paper.  IF YOU HAD POLYPS REMOVED DURING THE COLONOSCOPY: No aspirin products for 7 days or as instructed.  No alcohol for 7 days or as instructed.  Eat a soft diet for the next 24 hours.  FINDING OUT THE RESULTS OF YOUR TEST Not all test results are available during your visit. If your test results are not back during the visit, make an appointment with your caregiver to find out the results. Do not assume everything is normal if you have not heard from your  caregiver or the medical facility. It is important for you to follow up on all of your test results.  SEEK IMMEDIATE MEDICAL ATTENTION IF: You have more than a spotting of blood in your stool.  Your belly is swollen (abdominal distention).  You are nauseated or vomiting.  You have a temperature over 101.  You have abdominal pain or discomfort that is severe or gets worse throughout the day.   Your colonoscopy was relatively unremarkable.  I did not find any polyps or evidence of colon cancer.  Given your age, I think it is reasonable to not pursue further colonoscopies for colon cancer screening..  You do have diverticulosis. I would recommend increasing fiber in your diet or adding OTC Benefiber/Metamucil. Be sure to drink at least 4 to 6 glasses of water daily. Follow-up with GI as needed.   I hope you have a great rest of your week!  Elon Alas. Abbey Chatters, D.O. Gastroenterology and Hepatology [facility]

## 2022-05-05 NOTE — Op Note (Signed)
[facility] Patient Name: Karen Nunez Procedure Date: 05/05/2022 9:05 AM MRN: [id number] Date of Birth: 1945-09-29 Attending MD: Elon Alas. Abbey Chatters , [place], [id number] CSN: [id number] Age: 76 Admit Type: Outpatient Procedure:                Colonoscopy Indications:              Colon cancer screening in patient at increased                            risk: Colorectal cancer in mother Providers:                Elon Alas. Abbey Chatters, DO, Caprice Kluver, Raphael Gibney,                            Technician Referring MD:              Medicines:                See the Anesthesia note for documentation of the                            administered medications Complications:            No immediate complications. Estimated Blood Loss:     Estimated blood loss: none. Procedure:                Pre-Anesthesia Assessment:                           - The anesthesia plan was to use monitored                            anesthesia care (MAC).                           After obtaining informed consent, the colonoscope                            was passed under direct vision. Throughout the                            procedure, the patient's blood pressure, pulse, and                            oxygen saturations were monitored continuously. The                            [id number]-HQ190L ([id number]) scope was introduced through                            the anus and advanced to the the cecum, identified                            by appendiceal orifice and ileocecal valve. The                            colonoscopy was performed without difficulty. The  patient tolerated the procedure well. The quality                            of the bowel preparation was evaluated using the                            BBPS [facility] Bowel Preparation Scale) with scores                            of: Right Colon = 3, Transverse Colon = 3 and Left                            Colon = 3 (entire mucosa  seen well with no residual                            staining, small fragments of stool or opaque                            liquid). The total BBPS score equals 9. Scope In: 9:16:29 AM Scope Out: 9:32:47 AM Scope Withdrawal Time: 0 hours 12 minutes 20 seconds  Total Procedure Duration: 0 hours 16 minutes 18 seconds  Findings:      The perianal and digital rectal examinations were normal.      Scattered small-mouthed diverticula were found in the sigmoid colon,       descending colon and transverse colon.      The exam was otherwise without abnormality. Impression:               - Diverticulosis in the sigmoid colon, in the                            descending colon and in the transverse colon.                           - The examination was otherwise normal.                           - No specimens collected. Moderate Sedation:      Per Anesthesia Care Recommendation:           - Patient has a contact number available for                            emergencies. The signs and symptoms of potential                            delayed complications were discussed with the                            patient. Return to normal activities tomorrow.                            Written discharge instructions were provided to the  patient.                           - Resume previous diet.                           - Continue present medications.                           - No repeat colonoscopy due to age.                           - Return to GI clinic PRN. Procedure Code(s):        --- Professional ---                           Y6063, Colorectal cancer screening; colonoscopy on                            individual at high risk Diagnosis Code(s):        --- Professional ---                           Z80.0, Family history of malignant neoplasm of                            digestive organs                           K57.30, Diverticulosis of large intestine without                             perforation or abscess without bleeding CPT copyright 2022 American Medical Association. All rights reserved. The codes documented in this report are preliminary and upon coder review may  be revised to meet current compliance requirements. Elon Alas. Abbey Chatters, DO [place] Abbey Chatters, DO 05/05/2022 9:34:26 AM This report has been signed electronically. Number of Addenda: 0

## 2022-05-05 NOTE — Transfer of Care (Signed)
Immediate Anesthesia Transfer of Care Note  Patient: Karen Nunez  Procedure(s) Performed: COLONOSCOPY WITH PROPOFOL  Patient Location: Endoscopy Unit  Anesthesia Type:General  Level of Consciousness: awake, alert , oriented, and patient cooperative  Airway & Oxygen Therapy: Patient Spontanous Breathing  Post-op Assessment: Report given to RN, Post -op Vital signs reviewed and stable, and Patient moving all extremities  Post vital signs: Reviewed and stable  Last Vitals:  Vitals Value Taken Time  BP    Temp    Pulse    Resp    SpO2      Last Pain:  Vitals:   05/05/22 0911  TempSrc:   PainSc: 0-No pain      Patients Stated Pain Goal: 7 (46/96/29 [id number])  Complications: No notable events documented.

## 2022-05-05 NOTE — Anesthesia Preprocedure Evaluation (Signed)
Anesthesia Evaluation  Patient identified by MRN, date of birth, ID band Patient awake    Reviewed: Allergy & Precautions, H&P , NPO status , Patient's Chart, lab work & pertinent test results, reviewed documented beta blocker date and time   Airway Mallampati: II  TM Distance: >3 FB Neck ROM: Full    Dental  (+) Dental Advisory Given, Partial Upper   Pulmonary neg pulmonary ROS   Pulmonary exam normal breath sounds clear to auscultation       Cardiovascular Exercise Tolerance: Good (-) hypertensionPt. on home beta blockers Normal cardiovascular exam+ dysrhythmias  Rhythm:Regular Rate:Normal     Neuro/Psych negative neurological ROS  negative psych ROS   GI/Hepatic negative GI ROS, Neg liver ROS,,,  Endo/Other  negative endocrine ROS    Renal/GU negative Renal ROS     Musculoskeletal negative musculoskeletal ROS (+)    Abdominal   Peds  Hematology negative hematology ROS (+)   Anesthesia Other Findings   Reproductive/Obstetrics negative OB ROS                             Anesthesia Physical Anesthesia Plan  ASA: 2  Anesthesia Plan: General   Post-op Pain Management: Minimal or no pain anticipated   Induction:   PONV Risk Score and Plan: 1 and Propofol infusion  Airway Management Planned: Nasal Cannula and Natural Airway  Additional Equipment:   Intra-op Plan:   Post-operative Plan:   Informed Consent: I have reviewed the patients History and Physical, chart, labs and discussed the procedure including the risks, benefits and alternatives for the proposed anesthesia with the patient or authorized representative who has indicated his/her understanding and acceptance.     Dental advisory given  Plan Discussed with: CRNA and Surgeon  Anesthesia Plan Comments:         Anesthesia Quick Evaluation

## 2022-05-05 NOTE — Anesthesia Postprocedure Evaluation (Signed)
Anesthesia Post Note  Patient: Karen Nunez  Procedure(s) Performed: COLONOSCOPY WITH PROPOFOL  Patient location during evaluation: Phase II Anesthesia Type: General Level of consciousness: awake and alert and oriented Pain management: pain level controlled Vital Signs Assessment: post-procedure vital signs reviewed and stable Respiratory status: spontaneous breathing, nonlabored ventilation and respiratory function stable Cardiovascular status: blood pressure returned to baseline and stable Postop Assessment: no apparent nausea or vomiting Anesthetic complications: no  No notable events documented.   Last Vitals:  Vitals:   05/05/22 0940 05/05/22 0946  BP: 96/64 99/70  Pulse: 82 73  Resp: 18 (!) 23  Temp:    SpO2: 97% 98%    Last Pain:  Vitals:   05/05/22 0946  TempSrc:   PainSc: 0-No pain                 Jakhia Buxton C Virgle Arth

## 2022-05-08 ENCOUNTER — Encounter (HOSPITAL_COMMUNITY): Payer: Self-pay | Admitting: Internal Medicine

## 2022-05-11 DIAGNOSIS — R5383 Other fatigue: Secondary | ICD-10-CM | POA: Diagnosis not present

## 2022-05-11 DIAGNOSIS — E663 Overweight: Secondary | ICD-10-CM | POA: Diagnosis not present

## 2022-05-11 DIAGNOSIS — M8580 Other specified disorders of bone density and structure, unspecified site: Secondary | ICD-10-CM | POA: Diagnosis not present

## 2022-06-12 ENCOUNTER — Other Ambulatory Visit (HOSPITAL_COMMUNITY): Payer: Self-pay | Admitting: Internal Medicine

## 2022-06-12 DIAGNOSIS — Z1231 Encounter for screening mammogram for malignant neoplasm of breast: Secondary | ICD-10-CM

## 2022-07-06 ENCOUNTER — Ambulatory Visit (HOSPITAL_COMMUNITY)
Admission: RE | Admit: 2022-07-06 | Discharge: 2022-07-06 | Disposition: A | Discharge status: Home / Self Care | Payer: Medicare PPO | Source: Ambulatory Visit | Attending: Internal Medicine | Admitting: Internal Medicine

## 2022-07-06 DIAGNOSIS — Z1231 Encounter for screening mammogram for malignant neoplasm of breast: Secondary | ICD-10-CM | POA: Diagnosis not present

## 2022-07-24 ENCOUNTER — Other Ambulatory Visit: Payer: Self-pay | Admitting: Cardiology

## 2022-07-24 DIAGNOSIS — I471 Supraventricular tachycardia, unspecified: Secondary | ICD-10-CM | POA: Diagnosis not present

## 2022-07-30 DIAGNOSIS — I10 Essential (primary) hypertension: Secondary | ICD-10-CM | POA: Diagnosis not present

## 2022-07-30 DIAGNOSIS — H269 Unspecified cataract: Secondary | ICD-10-CM | POA: Diagnosis not present

## 2022-07-30 DIAGNOSIS — Z23 Encounter for immunization: Secondary | ICD-10-CM | POA: Diagnosis not present

## 2022-07-30 DIAGNOSIS — Z0000 Encounter for general adult medical examination without abnormal findings: Secondary | ICD-10-CM | POA: Diagnosis not present

## 2022-07-30 DIAGNOSIS — E663 Overweight: Secondary | ICD-10-CM | POA: Diagnosis not present

## 2022-07-30 DIAGNOSIS — I471 Supraventricular tachycardia, unspecified: Secondary | ICD-10-CM | POA: Diagnosis not present

## 2022-07-30 DIAGNOSIS — M8580 Other specified disorders of bone density and structure, unspecified site: Secondary | ICD-10-CM | POA: Diagnosis not present

## 2022-07-30 DIAGNOSIS — Z823 Family history of stroke: Secondary | ICD-10-CM | POA: Diagnosis not present

## 2022-10-26 ENCOUNTER — Other Ambulatory Visit: Payer: Self-pay | Admitting: Cardiology

## 2022-11-19 NOTE — Progress Notes (Signed)
Cardiology Office Note    Date:  11/20/2022  ID:  HUNTLEY BUTORAC, DOB [birth year], MRN [id number] Cardiologist: Little Ishikawa, MD    History of Present Illness:    Karen Nunez is a 77 y.o. female with past medical history of palpitations (PVC's, SVT and NSVT by prior monitor) who presents to the office today for annual follow-up.  She has been followed by Dr. Bjorn Pippin over the past few years given palpitations. Her episodes of SVT and NSVT along with her PVC burden had overall significantly improved since being started on Metoprolol. At the time of her last visit in 11/2021, she reported only rare palpitations and denied any recent anginal symptoms. Was continued on Toprol-XL 50mg  daily. She did have a calcium score for risk stratification which showed a calcium score of 0.   In talking with the patient today, she reports overall doing well from a cardiac perspective since her last office visit. She denies any recent chest pain or dyspnea on exertion. Experiences occasional palpitations but is overall asymptomatic with the PVC's noted on her EKG today. Does not consume alcohol and only consumes 1.5 cups of coffee daily. She denies any recent lightheadedness, dizziness or presyncope.  No recent orthopnea, PND or pitting edema. She does walk for at least an hour a day for exercise.  Studies Reviewed:   EKG: EKG is ordered today and demonstrates NSR, HR 89 with PVC's. No acute ST abnormalities.   Echocardiogram: 04/2020 IMPRESSIONS     1. Left ventricular ejection fraction, by estimation, is approximately  55%. The left ventricle has normal function. The left ventricle has no  regional wall motion abnormalities. Left ventricular diastolic parameters  were normal.   2. Right ventricular systolic function is normal. The right ventricular  size is normal. Tricuspid regurgitation signal is inadequate for assessing  PA pressure.   3. Left atrial size was mildly dilated.   4. The  mitral valve is grossly normal. Mild mitral valve regurgitation.   5. The aortic valve is tricuspid. Aortic valve regurgitation is not  visualized.   6. The inferior vena cava is normal in size with greater than 50%  respiratory variability, suggesting right atrial pressure of 3 mmHg.   Event Monitor: 08/2020 8 runs of SVT, longest lasting 9 beats Occasional PVCs (2.7%) and supraventricular couplets (1.7%)     Patch Wear Time:  3 days and 0 hours (2022-02-17T08:16:25-0500 to 2022-02-20T08:19:51-0500)   Patient had a min HR of 51 bpm, max HR of 176 bpm, and avg HR of 77 bpm. Predominant underlying rhythm was Sinus Rhythm. 8 Supraventricular Tachycardia runs occurred, the run with the fastest interval lasting 5 beats with a max rate of 176 bpm, the  longest lasting 9 beats with an avg rate of 129 bpm. Supraventricular Tachycardia was detected within +/- 45 seconds of symptomatic patient event(s). Isolated SVEs were rare (<1.0%, 1880), SVE Couplets were occasional (1.7%, 2786), and SVE Triplets were  rare (<1.0%, 83). Isolated VEs were occasional (2.7%, 9263), VE Couplets were rare (<1.0%, 442), and no VE Triplets were present. Ventricular Bigeminy and Trigeminy were present.  14 patient triggered events, corresponding to sinus rhythm  PACs/PVCs and SVT  Coronary Calcium Score: 12/2021 FINDINGS: Coronary arteries: Normal origins.   Coronary Calcium Score:   Left main: 0   Left anterior descending artery: 0   Left circumflex artery: 0   Right coronary artery: 0   Total: 0   Percentile: 0   Pericardium: Normal.  Ascending Aorta: Normal caliber.   Non-cardiac: See separate report from [facility].   IMPRESSION: Coronary calcium score of 0. This was 0 percentile for age-, race-, and sex-matched controls.  Physical Exam:   VS:  BP 116/68   Pulse 89   Ht 5\' 4"  (1.626 m)   Wt 176 lb (79.8 kg)   SpO2 96%   BMI 30.21 kg/m    Wt Readings from Last 3 Encounters:   11/20/22 176 lb (79.8 kg)  05/05/22 170 lb (77.1 kg)  11/17/21 175 lb 9.6 oz (79.7 kg)     GEN: Pleasant female appearing in no acute distress NECK: No JVD; No carotid bruits CARDIAC: RRR with occasional ectopic beats, no murmurs, rubs, gallops RESPIRATORY:  Clear to auscultation without rales, wheezing or rhonchi  ABDOMEN: Appears non-distended. No obvious abdominal masses. EXTREMITIES: No clubbing or cyanosis. No pitting edema.  Distal pedal pulses are 2+ bilaterally.   Assessment and Plan:   1. PVC's/SVT/Palpitations - She had an 8% PVC burden by prior monitor in 04/2020 and episodes of NSVT with the longest lasting 10 beats and brief SVT with repeat monitor in 08/2020 showing a 2.7% PVC burden and improvement in SVT.  - She has overall been doing well and reports minimal symptoms. Labs in 07/2022 by review of [facility] DXA showed normal Hgb and electrolytes. Will continue current medical therapy with Toprol-XL 50mg  daily.    Signed, Ellsworth Lennox, PA-C

## 2022-11-20 ENCOUNTER — Encounter: Payer: Self-pay | Admitting: Student

## 2022-11-20 ENCOUNTER — Ambulatory Visit: Payer: Medicare PPO | Attending: Student | Admitting: Student

## 2022-11-20 VITALS — BP 116/68 | HR 89 | Ht 64.0 in | Wt 176.0 lb

## 2022-11-20 DIAGNOSIS — I493 Ventricular premature depolarization: Secondary | ICD-10-CM

## 2022-11-20 DIAGNOSIS — I471 Supraventricular tachycardia, unspecified: Secondary | ICD-10-CM

## 2022-11-20 MED ORDER — METOPROLOL SUCCINATE ER 50 MG PO TB24: ORAL_TABLET | ORAL | 3 refills | Status: DC

## 2022-11-20 NOTE — Patient Instructions (Signed)
Medication Instructions:  Your physician recommends that you continue on your current medications as directed. Please refer to the Current Medication list given to you today.   Labwork: None today  Testing/Procedures: None today  Follow-Up: 1 year  Any Other Special Instructions Will Be Listed Below (If Applicable).  If you need a refill on your cardiac medications before your next appointment, please call your pharmacy.  

## 2023-01-22 DIAGNOSIS — I471 Supraventricular tachycardia, unspecified: Secondary | ICD-10-CM | POA: Diagnosis not present

## 2023-01-28 DIAGNOSIS — E669 Obesity, unspecified: Secondary | ICD-10-CM | POA: Diagnosis not present

## 2023-01-28 DIAGNOSIS — I10 Essential (primary) hypertension: Secondary | ICD-10-CM | POA: Diagnosis not present

## 2023-01-28 DIAGNOSIS — Z823 Family history of stroke: Secondary | ICD-10-CM | POA: Diagnosis not present

## 2023-01-28 DIAGNOSIS — H269 Unspecified cataract: Secondary | ICD-10-CM | POA: Diagnosis not present

## 2023-01-28 DIAGNOSIS — Z7182 Exercise counseling: Secondary | ICD-10-CM | POA: Diagnosis not present

## 2023-01-28 DIAGNOSIS — M8580 Other specified disorders of bone density and structure, unspecified site: Secondary | ICD-10-CM | POA: Diagnosis not present

## 2023-01-28 DIAGNOSIS — Z79899 Other long term (current) drug therapy: Secondary | ICD-10-CM | POA: Diagnosis not present

## 2023-01-28 DIAGNOSIS — I471 Supraventricular tachycardia, unspecified: Secondary | ICD-10-CM | POA: Diagnosis not present

## 2023-03-09 DIAGNOSIS — H43393 Other vitreous opacities, bilateral: Secondary | ICD-10-CM | POA: Diagnosis not present

## 2023-05-20 DIAGNOSIS — Z961 Presence of intraocular lens: Secondary | ICD-10-CM | POA: Diagnosis not present

## 2023-05-20 DIAGNOSIS — H01005 Unspecified blepharitis left lower eyelid: Secondary | ICD-10-CM | POA: Diagnosis not present

## 2023-05-20 DIAGNOSIS — H43812 Vitreous degeneration, left eye: Secondary | ICD-10-CM | POA: Diagnosis not present

## 2023-05-20 DIAGNOSIS — H02831 Dermatochalasis of right upper eyelid: Secondary | ICD-10-CM | POA: Diagnosis not present

## 2023-05-20 DIAGNOSIS — H02834 Dermatochalasis of left upper eyelid: Secondary | ICD-10-CM | POA: Diagnosis not present

## 2023-05-20 DIAGNOSIS — H01002 Unspecified blepharitis right lower eyelid: Secondary | ICD-10-CM | POA: Diagnosis not present

## 2023-05-20 DIAGNOSIS — H01001 Unspecified blepharitis right upper eyelid: Secondary | ICD-10-CM | POA: Diagnosis not present

## 2023-05-20 DIAGNOSIS — H01004 Unspecified blepharitis left upper eyelid: Secondary | ICD-10-CM | POA: Diagnosis not present

## 2023-05-20 DIAGNOSIS — H26491 Other secondary cataract, right eye: Secondary | ICD-10-CM | POA: Diagnosis not present

## 2023-05-24 ENCOUNTER — Other Ambulatory Visit (HOSPITAL_COMMUNITY): Payer: Self-pay | Admitting: Internal Medicine

## 2023-05-24 DIAGNOSIS — Z1231 Encounter for screening mammogram for malignant neoplasm of breast: Secondary | ICD-10-CM

## 2023-05-25 DIAGNOSIS — Z823 Family history of stroke: Secondary | ICD-10-CM | POA: Diagnosis not present

## 2023-05-25 DIAGNOSIS — Z809 Family history of malignant neoplasm, unspecified: Secondary | ICD-10-CM | POA: Diagnosis not present

## 2023-05-25 DIAGNOSIS — Z8249 Family history of ischemic heart disease and other diseases of the circulatory system: Secondary | ICD-10-CM | POA: Diagnosis not present

## 2023-05-25 DIAGNOSIS — M8580 Other specified disorders of bone density and structure, unspecified site: Secondary | ICD-10-CM | POA: Diagnosis not present

## 2023-05-25 DIAGNOSIS — E669 Obesity, unspecified: Secondary | ICD-10-CM | POA: Diagnosis not present

## 2023-05-25 DIAGNOSIS — N182 Chronic kidney disease, stage 2 (mild): Secondary | ICD-10-CM | POA: Diagnosis not present

## 2023-05-25 DIAGNOSIS — I472 Ventricular tachycardia, unspecified: Secondary | ICD-10-CM | POA: Diagnosis not present

## 2023-05-25 DIAGNOSIS — I129 Hypertensive chronic kidney disease with stage 1 through stage 4 chronic kidney disease, or unspecified chronic kidney disease: Secondary | ICD-10-CM | POA: Diagnosis not present

## 2023-05-25 DIAGNOSIS — Z6830 Body mass index (BMI) 30.0-30.9, adult: Secondary | ICD-10-CM | POA: Diagnosis not present

## 2023-07-09 ENCOUNTER — Encounter (HOSPITAL_COMMUNITY): Payer: Self-pay

## 2023-07-09 ENCOUNTER — Ambulatory Visit (HOSPITAL_COMMUNITY)
Admission: RE | Admit: 2023-07-09 | Discharge: 2023-07-09 | Disposition: A | Discharge status: Home / Self Care | Payer: Medicare PPO | Source: Ambulatory Visit | Attending: Internal Medicine | Admitting: Internal Medicine

## 2023-07-09 DIAGNOSIS — Z1231 Encounter for screening mammogram for malignant neoplasm of breast: Secondary | ICD-10-CM | POA: Diagnosis not present

## 2023-08-02 DIAGNOSIS — M8580 Other specified disorders of bone density and structure, unspecified site: Secondary | ICD-10-CM | POA: Diagnosis not present

## 2023-08-02 DIAGNOSIS — I471 Supraventricular tachycardia, unspecified: Secondary | ICD-10-CM | POA: Diagnosis not present

## 2023-08-09 ENCOUNTER — Encounter: Payer: Self-pay | Admitting: Internal Medicine

## 2023-08-09 DIAGNOSIS — Z823 Family history of stroke: Secondary | ICD-10-CM | POA: Diagnosis not present

## 2023-08-09 DIAGNOSIS — E669 Obesity, unspecified: Secondary | ICD-10-CM | POA: Diagnosis not present

## 2023-08-09 DIAGNOSIS — I10 Essential (primary) hypertension: Secondary | ICD-10-CM | POA: Diagnosis not present

## 2023-08-09 DIAGNOSIS — I471 Supraventricular tachycardia, unspecified: Secondary | ICD-10-CM | POA: Diagnosis not present

## 2023-08-09 DIAGNOSIS — Z0001 Encounter for general adult medical examination with abnormal findings: Secondary | ICD-10-CM | POA: Diagnosis not present

## 2023-08-09 DIAGNOSIS — Z0000 Encounter for general adult medical examination without abnormal findings: Secondary | ICD-10-CM | POA: Diagnosis not present

## 2023-08-09 DIAGNOSIS — H269 Unspecified cataract: Secondary | ICD-10-CM | POA: Diagnosis not present

## 2023-08-09 DIAGNOSIS — M8580 Other specified disorders of bone density and structure, unspecified site: Secondary | ICD-10-CM | POA: Diagnosis not present

## 2023-08-30 DIAGNOSIS — D225 Melanocytic nevi of trunk: Secondary | ICD-10-CM | POA: Diagnosis not present

## 2023-08-30 DIAGNOSIS — L821 Other seborrheic keratosis: Secondary | ICD-10-CM | POA: Diagnosis not present

## 2023-08-30 DIAGNOSIS — C44519 Basal cell carcinoma of skin of other part of trunk: Secondary | ICD-10-CM | POA: Diagnosis not present

## 2024-02-01 ENCOUNTER — Other Ambulatory Visit: Payer: Self-pay | Admitting: Student

## 2024-02-21 ENCOUNTER — Other Ambulatory Visit (HOSPITAL_COMMUNITY): Payer: Self-pay | Admitting: Internal Medicine

## 2024-02-21 DIAGNOSIS — M8580 Other specified disorders of bone density and structure, unspecified site: Secondary | ICD-10-CM

## 2024-03-15 ENCOUNTER — Ambulatory Visit (HOSPITAL_COMMUNITY)
Admission: RE | Admit: 2024-03-15 | Discharge: 2024-03-15 | Disposition: A | Discharge status: Home / Self Care | Source: Ambulatory Visit | Attending: Internal Medicine | Admitting: Internal Medicine

## 2024-03-15 DIAGNOSIS — Z780 Asymptomatic menopausal state: Secondary | ICD-10-CM | POA: Diagnosis not present

## 2024-03-15 DIAGNOSIS — M8589 Other specified disorders of bone density and structure, multiple sites: Secondary | ICD-10-CM | POA: Diagnosis not present

## 2024-03-15 DIAGNOSIS — M8580 Other specified disorders of bone density and structure, unspecified site: Secondary | ICD-10-CM | POA: Insufficient documentation

## 2024-03-15 DIAGNOSIS — Z13820 Encounter for screening for osteoporosis: Secondary | ICD-10-CM | POA: Insufficient documentation

## 2024-03-15 DIAGNOSIS — M85852 Other specified disorders of bone density and structure, left thigh: Secondary | ICD-10-CM | POA: Diagnosis not present

## 2024-04-06 NOTE — Progress Notes (Unsigned)
 Cardiology Office Note    Date:  04/07/2024  ID:  Karen Nunez, DOB [birth year], MRN [id number] Cardiologist: Lonni LITTIE Nanas, MD Cardiology APP:  Johnson Laymon HERO, PA-C { :  History of Present Illness:    Karen Nunez is a 78 y.o. female with past medical history of palpitations (PVC's, SVT and NSVT by prior monitor) and coronary calcium score of 0 by CT in 12/2021 who presents to the office today for annual follow-up.  She was last examined by myself in 11/2022 and denied any chest pain or dyspnea on exertion. Was experiencing occasional palpitations but no persistent symptoms. She was continued on Toprol -XL 50 mg daily.  In talking the patient today, she reports overall feeling well since her last office visit. She does report having palpitations one day but this occurred after consuming [employer] soda while at [employer]. No associated dizziness or presyncope at that time.  She typically tries to avoid caffeine and mostly consumes water  and ginger ale. No alcohol intake. She walks for exercise and denies any recent chest pain or dyspnea on exertion with this. No specific orthopnea, PND or pitting edema.  Studies Reviewed:   EKG: EKG is ordered today and demonstrates:   EKG Interpretation Date/Time:  Friday April 07 2024 13:57:20 EDT Ventricular Rate:  87 PR Interval:  136 QRS Duration:  74 QT Interval:  346 QTC Calculation: 416 R Axis:   83  Text Interpretation: Sinus rhythm with sinus arrhythmia with occasional Premature ventricular complexes Nonspecific ST abnormality Confirmed by Johnson Laymon ([id number]) on 04/07/2024 2:12:59 PM       Echocardiogram: 04/2020 IMPRESSIONS     1. Left ventricular ejection fraction, by estimation, is approximately  55%. The left ventricle has normal function. The left ventricle has no  regional wall motion abnormalities. Left ventricular diastolic parameters  were normal.   2. Right ventricular systolic function is  normal. The right ventricular  size is normal. Tricuspid regurgitation signal is inadequate for assessing  PA pressure.   3. Left atrial size was mildly dilated.   4. The mitral valve is grossly normal. Mild mitral valve regurgitation.   5. The aortic valve is tricuspid. Aortic valve regurgitation is not  visualized.   6. The inferior vena cava is normal in size with greater than 50%  respiratory variability, suggesting right atrial pressure of 3 mmHg.   Event Monitor: 08/2020 8 runs of SVT, longest lasting 9 beats Occasional PVCs (2.7%) and supraventricular couplets (1.7%)   Calcium Score: 12/2021 FINDINGS: Coronary arteries: Normal origins.   Coronary Calcium Score:   Left main: 0   Left anterior descending artery: 0   Left circumflex artery: 0   Right coronary artery: 0   Total: 0   Percentile: 0   Pericardium: Normal.   Ascending Aorta: Normal caliber.   Non-cardiac: See separate report from [facility].   IMPRESSION: Coronary calcium score of 0. This was 0 percentile for age-, race-, and sex-matched controls.   Physical Exam:   VS:  BP 106/62 (BP Location: Left Arm, Cuff Size: Normal)   Pulse 87   Ht 5' 4 (1.626 m)   Wt 164 lb (74.4 kg)   SpO2 97%   BMI 28.15 kg/m    Wt Readings from Last 3 Encounters:  04/07/24 164 lb (74.4 kg)  11/20/22 176 lb (79.8 kg)  05/05/22 170 lb (77.1 kg)     GEN: Pleasant female appearing in no acute distress NECK: No JVD; No carotid  bruits CARDIAC: RRR, no murmurs, rubs, gallops RESPIRATORY:  Clear to auscultation without rales, wheezing or rhonchi  ABDOMEN: Appears non-distended. No obvious abdominal masses. EXTREMITIES: No clubbing or cyanosis. No pitting edema.  Distal pedal pulses are 2+ bilaterally.   Assessment and Plan:   1. Palpitations/PVC's - Prior monitor in 2021 showed an 8% PVC burden and this had improved to 2.7% by repeat monitor in 2022 following initiation of beta-blocker therapy. She  does have occasional PVC's on her EKG today as noted on prior tracings. Labs were checked by her PCP earlier this year with no significant abnormalities. - For now, will continue current medical therapy with Toprol -XL 50 mg daily as symptoms have overall been well-controlled. I encouraged her to make us  aware if she has more frequent palpitations or any episodes of near syncope/syncope as we could arrange for a 3-day Zio patch to reassess her PVC burden and rule-out significant arrhythmias.  Signed, Laymon CHRISTELLA Qua, PA-C

## 2024-04-07 ENCOUNTER — Ambulatory Visit: Attending: Student | Admitting: Student

## 2024-04-07 ENCOUNTER — Encounter: Payer: Self-pay | Admitting: Student

## 2024-04-07 VITALS — BP 106/62 | HR 87 | Ht 64.0 in | Wt 164.0 lb

## 2024-04-07 DIAGNOSIS — I471 Supraventricular tachycardia, unspecified: Secondary | ICD-10-CM | POA: Diagnosis not present

## 2024-04-07 DIAGNOSIS — R002 Palpitations: Secondary | ICD-10-CM

## 2024-04-07 NOTE — Patient Instructions (Signed)
 Medication Instructions:   Continue current medication regimen. If your palpitations increase in frequency or you have near-syncopal episodes, please make us  aware.   *If you need a refill on your cardiac medications before your next appointment, please call your pharmacy*   Follow-Up: At [facility], you and your health needs are our priority.  As part of our continuing mission to provide you with exceptional heart care, our providers are all part of one team.  This team includes your primary Cardiologist (physician) and Advanced Practice Providers or APPs (Physician Assistants and Nurse Practitioners) who all work together to provide you with the care you need, when you need it.  Your next appointment:   1 year(s)  Provider:   You may see Lonni LITTIE Nanas, MD or one of the following Advanced Practice Providers on your designated Care Team:   Laymon Qua, PA-C  [place], [place] Olivia [place], [place]     We recommend signing up for the patient portal called MyChart.  Sign up information is provided on this After Visit Summary.  MyChart is used to connect with patients for Virtual Visits (Telemedicine).  Patients are able to view lab/test results, encounter notes, upcoming appointments, etc.  Non-urgent messages can be sent to your provider as well.   To learn more about what you can do with MyChart, go to [email or web address].
